# Patient Record
Sex: Female | Born: 1940 | Race: Black or African American | Hispanic: No | Smoking: Former smoker
Health system: Southern US, Community
[De-identification: ages and names within clinical notes are randomized; demographics above are authoritative.]

## PROBLEM LIST (undated history)

## (undated) DIAGNOSIS — B029 Zoster without complications: Secondary | ICD-10-CM

## (undated) DIAGNOSIS — E039 Hypothyroidism, unspecified: Secondary | ICD-10-CM

## (undated) DIAGNOSIS — M1990 Unspecified osteoarthritis, unspecified site: Secondary | ICD-10-CM

## (undated) DIAGNOSIS — F419 Anxiety disorder, unspecified: Secondary | ICD-10-CM

## (undated) DIAGNOSIS — K219 Gastro-esophageal reflux disease without esophagitis: Secondary | ICD-10-CM

## (undated) DIAGNOSIS — I10 Essential (primary) hypertension: Secondary | ICD-10-CM

## (undated) HISTORY — PX: OTHER SURGICAL HISTORY: SHX169

## (undated) HISTORY — DX: Essential (primary) hypertension: I10

## (undated) HISTORY — PX: APPENDECTOMY: SHX54

## (undated) HISTORY — PX: TUBAL LIGATION: SHX77

## (undated) HISTORY — PX: GANGLION CYST EXCISION: SHX1691

---

## 1999-02-28 ENCOUNTER — Other Ambulatory Visit: Admission: RE | Admit: 1999-02-28 | Discharge: 1999-02-28 | Payer: Self-pay | Admitting: Obstetrics & Gynecology

## 2000-06-16 ENCOUNTER — Other Ambulatory Visit: Admission: RE | Admit: 2000-06-16 | Discharge: 2000-06-16 | Payer: Self-pay | Admitting: Obstetrics & Gynecology

## 2000-08-11 ENCOUNTER — Encounter: Admission: RE | Admit: 2000-08-11 | Discharge: 2000-11-09 | Payer: Self-pay | Admitting: Internal Medicine

## 2001-02-03 ENCOUNTER — Other Ambulatory Visit: Admission: RE | Admit: 2001-02-03 | Discharge: 2001-02-03 | Payer: Self-pay | Admitting: Obstetrics & Gynecology

## 2001-11-18 ENCOUNTER — Encounter: Payer: Self-pay | Admitting: Internal Medicine

## 2001-11-18 ENCOUNTER — Encounter: Admission: RE | Admit: 2001-11-18 | Discharge: 2001-11-18 | Payer: Self-pay | Admitting: Internal Medicine

## 2002-02-11 ENCOUNTER — Other Ambulatory Visit: Admission: RE | Admit: 2002-02-11 | Discharge: 2002-02-11 | Payer: Self-pay | Admitting: Obstetrics and Gynecology

## 2002-02-21 ENCOUNTER — Inpatient Hospital Stay (HOSPITAL_COMMUNITY): Admission: RE | Admit: 2002-02-21 | Discharge: 2002-02-22 | Payer: Self-pay | Admitting: Orthopedic Surgery

## 2002-02-21 ENCOUNTER — Encounter: Payer: Self-pay | Admitting: Neurosurgery

## 2003-03-27 ENCOUNTER — Other Ambulatory Visit: Admission: RE | Admit: 2003-03-27 | Discharge: 2003-03-27 | Payer: Self-pay | Admitting: Obstetrics & Gynecology

## 2003-08-29 ENCOUNTER — Ambulatory Visit (HOSPITAL_COMMUNITY): Admission: RE | Admit: 2003-08-29 | Discharge: 2003-08-29 | Payer: Self-pay | Admitting: Gastroenterology

## 2004-09-02 ENCOUNTER — Other Ambulatory Visit: Admission: RE | Admit: 2004-09-02 | Discharge: 2004-09-02 | Payer: Self-pay | Admitting: Obstetrics & Gynecology

## 2005-09-29 ENCOUNTER — Other Ambulatory Visit: Admission: RE | Admit: 2005-09-29 | Discharge: 2005-09-29 | Payer: Self-pay | Admitting: Obstetrics & Gynecology

## 2010-10-05 ENCOUNTER — Emergency Department (HOSPITAL_COMMUNITY)
Admission: EM | Admit: 2010-10-05 | Discharge: 2010-10-05 | Payer: Self-pay | Source: Home / Self Care | Admitting: Emergency Medicine

## 2010-10-08 ENCOUNTER — Inpatient Hospital Stay (HOSPITAL_COMMUNITY)
Admission: RE | Admit: 2010-10-08 | Discharge: 2010-10-10 | Payer: Self-pay | Source: Home / Self Care | Attending: Orthopedic Surgery | Admitting: Orthopedic Surgery

## 2011-01-14 LAB — URINALYSIS, ROUTINE W REFLEX MICROSCOPIC
Glucose, UA: NEGATIVE mg/dL
Nitrite: NEGATIVE
Protein, ur: NEGATIVE mg/dL
Specific Gravity, Urine: 1.026 (ref 1.005–1.030)
Urobilinogen, UA: 0.2 mg/dL (ref 0.0–1.0)
pH: 5 (ref 5.0–8.0)

## 2011-01-14 LAB — CBC
HCT: 40.3 % (ref 36.0–46.0)
Hemoglobin: 13.7 g/dL (ref 12.0–15.0)
MCH: 31.9 pg (ref 26.0–34.0)
MCHC: 34 g/dL (ref 30.0–36.0)
MCV: 93.7 fL (ref 78.0–100.0)
Platelets: 227 10*3/uL (ref 150–400)
RBC: 4.3 MIL/uL (ref 3.87–5.11)
RDW: 13.3 % (ref 11.5–15.5)
WBC: 8.9 10*3/uL (ref 4.0–10.5)

## 2011-01-14 LAB — COMPREHENSIVE METABOLIC PANEL
ALT: 24 U/L (ref 0–35)
AST: 30 U/L (ref 0–37)
Albumin: 4.2 g/dL (ref 3.5–5.2)
Alkaline Phosphatase: 75 U/L (ref 39–117)
BUN: 10 mg/dL (ref 6–23)
CO2: 28 mEq/L (ref 19–32)
Calcium: 9.9 mg/dL (ref 8.4–10.5)
Chloride: 101 mEq/L (ref 96–112)
Creatinine, Ser: 0.7 mg/dL (ref 0.4–1.2)
GFR calc Af Amer: >60 mL/min (ref >60)
GFR calc non Af Amer: >60 mL/min (ref >60)
Glucose, Bld: 89 mg/dL (ref 70–99)
Potassium: 3.7 mEq/L (ref 3.5–5.1)
Sodium: 139 mEq/L (ref 135–145)
Total Bilirubin: 0.7 mg/dL (ref 0.3–1.2)
Total Protein: 7.2 g/dL (ref 6.0–8.3)

## 2011-01-14 LAB — DIFFERENTIAL
Basophils Absolute: 0.1 10*3/uL (ref 0.0–0.1)
Basophils Relative: 1 % (ref 0–1)
Eosinophils Absolute: 0.2 10*3/uL (ref 0.0–0.7)
Eosinophils Relative: 2 % (ref 0–5)
Lymphocytes Relative: 33 % (ref 12–46)
Lymphs Abs: 2.9 10*3/uL (ref 0.7–4.0)
Monocytes Absolute: 0.7 10*3/uL (ref 0.1–1.0)
Monocytes Relative: 8 % (ref 3–12)
Neutro Abs: 5.1 10*3/uL (ref 1.7–7.7)
Neutrophils Relative %: 57 % (ref 43–77)

## 2011-01-14 LAB — APTT
aPTT: 26 seconds (ref 24–37)
aPTT: >200 seconds (ref 24–37)

## 2011-01-14 LAB — URINE MICROSCOPIC-ADD ON

## 2011-01-14 LAB — SURGICAL PCR SCREEN
MRSA, PCR: NEGATIVE
Staphylococcus aureus: NEGATIVE

## 2011-01-14 LAB — PROTIME-INR
INR: 1.02 (ref 0.00–1.49)
INR: 1.04 (ref 0.00–1.49)
INR: 1.05 (ref 0.00–1.49)
INR: 1.3 (ref 0.00–1.49)
Prothrombin Time: 13.6 seconds (ref 11.6–15.2)
Prothrombin Time: 13.8 seconds (ref 11.6–15.2)
Prothrombin Time: 13.9 seconds (ref 11.6–15.2)
Prothrombin Time: 16.4 seconds — ABNORMAL HIGH (ref 11.6–15.2)

## 2011-03-21 NOTE — Op Note (Signed)
[place]  Patient:    Katrina Wall, Katrina Wall Visit Number: [id number] MRN: [id number]          Service Type: SUR Location: [id number] Attending Physician:  Barton Fanny Dictated by:   Hewitt Shorts, M.D. Proc. 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  OPERATION PERFORMED:  C3-4 and C4-5 anterior cervical diskectomy and arthrodesis with iliac crest allograft and tether cervical plating.  SURGEON:  Hewitt Shorts, M.D.  ANESTHESIA:  General endotracheal.  INDICATIONS FOR PROCEDURE:  The patient is a 70 year old woman who presented with numbness in her right hand.  She was found to have a large central disk herniation with spinal cord compression at the C3-4 level with advanced spondylosis and degenerative disk disease at C4-5.  Decision was made to proceed with a two-level anterior cervical decompression and arthrodesis.  DESCRIPTION OF PROCEDURE:  The patient was brought to the operating room and placed under general endotracheal anesthesia.  The patient was placed in 10 pounds of halter traction and then the neck was prepped with Betadine soap and solution and draped in sterile fashion.  A horizontal incision was made on the left side of the neck.  The line of incision was infiltrated with local anesthetic with epinephrine and the incision was made with a Shaw scalpel at a temp of 120.  Dissection was carried down through the subcutaneous tissues and platysma and then dissection was carried out to an avascular plane leaving a sternocleidomastoid, carotid artery and jugular vein laterally and trachea and esophagus medially.  The ventral aspect of the  vertebral column was identified and a localizing x-ray was taken and the C3-4 and C4-5 intervertebral disk spaces were identified.  The diskectomy was begun at each level with incision of the annulus and continued with microcurets and pituitary rongeurs.  The microscope was draped and brought into the field to provide additional magnification, illumination and visualization.  The remainder of the procedure was performed using microdissection and microsurgical technique. The cartilaginous end plates of the corresponding vertebrae were removed using microcurets and the Micromax drill and then posterior osteophytic overgrowth at each level was removed using the Micromax drill and a 10 mm Kerrison punch with a thin foot plate.  At each level we encountered significant spondylosis, degenerative disk disease and herniation and this material was removed allowing [place] to decompress the thecal sac and spinal canal.  Once the decompression was completed, hemostasis was established with the use of Gelfoam soaked in thrombin and then we placed a graft of iliac crest allograft in each intervertebral disk space. Each of the grafts was cut and shaped to size and positioned and countersunk. We then selected a 35 mm tether cervical plate and it was positioned over the fusion construct and secured with 4.0 x 13 mm screws at C3, at C5 and a single 4.0 x 14 mm screw at C4.  All screw holes were drilled, tapped and then the screw placed.  Final tightening was done of all five screws.  An x-ray was taken which showed the graft, plate and screws all in good position, the alignment was good.  The wound was irrigated with bacitracin solution and checked for hemostasis which was established and confirmed.  Then we proceeded with closure.  The platysma was closed with interrupted inverted 2-0 undyed Vicryl sutures.  The subcutaneous and subcuticular layer were closed with interrupted inverted 3-0 undyed Vicryl sutures  and the skin edges were approximated with Dermabond.  The patient tolerated the procedure well. Estimated blood loss was 200 cc.  Sponge, needle and instrument counts were correct.  Following surgery, the patient was taken out of cervical traction and was placed in a soft cervical collar to be reversed from anesthetic, extubated and transferred to the recovery room for further care. Dictated by:   Hewitt Shorts, M.D. Attending Physician:  Barton Fanny DD:  02/21/02 TD:  02/22/02 Job: [id number]

## 2012-05-25 DIAGNOSIS — E039 Hypothyroidism, unspecified: Secondary | ICD-10-CM | POA: Diagnosis not present

## 2012-05-31 DIAGNOSIS — Z1231 Encounter for screening mammogram for malignant neoplasm of breast: Secondary | ICD-10-CM | POA: Diagnosis not present

## 2012-06-21 DIAGNOSIS — E039 Hypothyroidism, unspecified: Secondary | ICD-10-CM | POA: Diagnosis not present

## 2012-06-24 DIAGNOSIS — Z130 Encounter for screening for diseases of the blood and blood-forming organs and certain disorders involving the immune mechanism: Secondary | ICD-10-CM | POA: Diagnosis not present

## 2012-06-24 DIAGNOSIS — Z1212 Encounter for screening for malignant neoplasm of rectum: Secondary | ICD-10-CM | POA: Diagnosis not present

## 2012-06-24 DIAGNOSIS — M810 Age-related osteoporosis without current pathological fracture: Secondary | ICD-10-CM | POA: Diagnosis not present

## 2012-06-24 DIAGNOSIS — Z124 Encounter for screening for malignant neoplasm of cervix: Secondary | ICD-10-CM | POA: Diagnosis not present

## 2012-07-26 DIAGNOSIS — I10 Essential (primary) hypertension: Secondary | ICD-10-CM | POA: Diagnosis not present

## 2012-07-26 DIAGNOSIS — M949 Disorder of cartilage, unspecified: Secondary | ICD-10-CM | POA: Diagnosis not present

## 2012-07-26 DIAGNOSIS — J309 Allergic rhinitis, unspecified: Secondary | ICD-10-CM | POA: Diagnosis not present

## 2012-07-26 DIAGNOSIS — M109 Gout, unspecified: Secondary | ICD-10-CM | POA: Diagnosis not present

## 2012-07-26 DIAGNOSIS — E039 Hypothyroidism, unspecified: Secondary | ICD-10-CM | POA: Diagnosis not present

## 2012-07-26 DIAGNOSIS — Z7901 Long term (current) use of anticoagulants: Secondary | ICD-10-CM | POA: Diagnosis not present

## 2012-07-26 DIAGNOSIS — M899 Disorder of bone, unspecified: Secondary | ICD-10-CM | POA: Diagnosis not present

## 2012-08-12 DIAGNOSIS — Z23 Encounter for immunization: Secondary | ICD-10-CM | POA: Diagnosis not present

## 2012-08-23 DIAGNOSIS — E039 Hypothyroidism, unspecified: Secondary | ICD-10-CM | POA: Diagnosis not present

## 2012-08-27 DIAGNOSIS — E039 Hypothyroidism, unspecified: Secondary | ICD-10-CM | POA: Diagnosis not present

## 2012-10-03 HISTORY — PX: OTHER SURGICAL HISTORY: SHX169

## 2012-10-08 DIAGNOSIS — B029 Zoster without complications: Secondary | ICD-10-CM | POA: Diagnosis not present

## 2012-10-08 HISTORY — DX: Zoster without complications: B02.9

## 2012-11-30 DIAGNOSIS — E039 Hypothyroidism, unspecified: Secondary | ICD-10-CM | POA: Diagnosis not present

## 2012-12-22 DIAGNOSIS — E039 Hypothyroidism, unspecified: Secondary | ICD-10-CM | POA: Diagnosis not present

## 2013-01-24 DIAGNOSIS — M899 Disorder of bone, unspecified: Secondary | ICD-10-CM | POA: Diagnosis not present

## 2013-01-24 DIAGNOSIS — E039 Hypothyroidism, unspecified: Secondary | ICD-10-CM | POA: Diagnosis not present

## 2013-01-24 DIAGNOSIS — E782 Mixed hyperlipidemia: Secondary | ICD-10-CM | POA: Diagnosis not present

## 2013-01-24 DIAGNOSIS — Z0000 Encounter for general adult medical examination without abnormal findings: Secondary | ICD-10-CM | POA: Diagnosis not present

## 2013-01-24 DIAGNOSIS — M109 Gout, unspecified: Secondary | ICD-10-CM | POA: Diagnosis not present

## 2013-01-24 DIAGNOSIS — M25569 Pain in unspecified knee: Secondary | ICD-10-CM | POA: Diagnosis not present

## 2013-01-24 DIAGNOSIS — M949 Disorder of cartilage, unspecified: Secondary | ICD-10-CM | POA: Diagnosis not present

## 2013-01-24 DIAGNOSIS — I10 Essential (primary) hypertension: Secondary | ICD-10-CM | POA: Diagnosis not present

## 2013-01-24 DIAGNOSIS — Z1331 Encounter for screening for depression: Secondary | ICD-10-CM | POA: Diagnosis not present

## 2013-01-26 DIAGNOSIS — M949 Disorder of cartilage, unspecified: Secondary | ICD-10-CM | POA: Diagnosis not present

## 2013-01-26 DIAGNOSIS — I10 Essential (primary) hypertension: Secondary | ICD-10-CM | POA: Diagnosis not present

## 2013-01-26 DIAGNOSIS — Z0000 Encounter for general adult medical examination without abnormal findings: Secondary | ICD-10-CM | POA: Diagnosis not present

## 2013-01-26 DIAGNOSIS — E039 Hypothyroidism, unspecified: Secondary | ICD-10-CM | POA: Diagnosis not present

## 2013-01-26 DIAGNOSIS — E782 Mixed hyperlipidemia: Secondary | ICD-10-CM | POA: Diagnosis not present

## 2013-01-26 DIAGNOSIS — M899 Disorder of bone, unspecified: Secondary | ICD-10-CM | POA: Diagnosis not present

## 2013-05-05 DIAGNOSIS — H47099 Other disorders of optic nerve, not elsewhere classified, unspecified eye: Secondary | ICD-10-CM | POA: Diagnosis not present

## 2013-06-13 DIAGNOSIS — M1710 Unilateral primary osteoarthritis, unspecified knee: Secondary | ICD-10-CM | POA: Diagnosis not present

## 2013-06-13 DIAGNOSIS — IMO0002 Reserved for concepts with insufficient information to code with codable children: Secondary | ICD-10-CM | POA: Diagnosis not present

## 2013-06-14 ENCOUNTER — Other Ambulatory Visit: Payer: Self-pay | Admitting: *Deleted

## 2013-06-14 DIAGNOSIS — E039 Hypothyroidism, unspecified: Secondary | ICD-10-CM | POA: Insufficient documentation

## 2013-06-21 ENCOUNTER — Other Ambulatory Visit (INDEPENDENT_AMBULATORY_CARE_PROVIDER_SITE_OTHER): Payer: Medicare Other

## 2013-06-21 DIAGNOSIS — E039 Hypothyroidism, unspecified: Secondary | ICD-10-CM

## 2013-06-21 LAB — TSH: TSH: 5.98 u[IU]/mL — ABNORMAL HIGH (ref 0.35–5.50)

## 2013-06-21 LAB — T4, FREE: Free T4: 0.78 ng/dL (ref 0.60–1.60)

## 2013-06-22 ENCOUNTER — Ambulatory Visit: Payer: Self-pay | Admitting: Endocrinology

## 2013-06-23 ENCOUNTER — Encounter: Payer: Self-pay | Admitting: Endocrinology

## 2013-06-23 ENCOUNTER — Ambulatory Visit (INDEPENDENT_AMBULATORY_CARE_PROVIDER_SITE_OTHER): Payer: Medicare Other | Admitting: Endocrinology

## 2013-06-23 VITALS — BP 130/70 | HR 70 | Temp 98.6°F | Resp 12 | Ht 64.0 in | Wt 179.1 lb

## 2013-06-23 DIAGNOSIS — E039 Hypothyroidism, unspecified: Secondary | ICD-10-CM

## 2013-06-23 MED ORDER — LEVOTHYROXINE SODIUM 125 MCG PO TABS: 125.0000 ug | ORAL_TABLET | Freq: Every day | ORAL | Status: DC

## 2013-06-23 NOTE — Patient Instructions (Addendum)
Change to 125 levothyroxine

## 2013-06-23 NOTE — Progress Notes (Signed)
Patient ID: Katrina Wall, female   DOB: 01/30/1941, 72 y.o.   MRN: [id number]  Reason for Appointment:  Hypothyroidism, followup visit    History of Present Illness:     She had been on a stable dose of levothyroxine for her hypothyroidism for sometime The symptoms consistent with hypothyroidism at the time of diagnosis : none      The treatments that the patient has taken include generic Synthroid, now taking 112 mcg.       Last visit was in 2/14  and her dosage was not changed at that time  Currently she does not complain of any significant fatigue         Compliance with the medical regimen has been as prescribed with taking the tablet in the morning before breakfast.  Appointment on 06/21/2013  Component Date Value Range Status  . Free T4 06/21/2013 0.78  0.60 - 1.60 ng/dL Final  . TSH 30/86/5784 5.98* 0.35 - 5.50 uIU/mL Final      Medication List       This list is accurate as of: 06/23/13 11:59 PM.  Always use your most recent med list.               Acai 500 MG Caps  Take 500 mg by mouth.     allopurinol 100 MG tablet  Commonly known as:  ZYLOPRIM  Take 100 mg by mouth 2 (two) times daily.     aspirin 81 MG tablet  Take 81 mg by mouth daily.     Calcium-Magnesium-Zinc 167-83-8 MG Tabs  Take by mouth 3 (three) times daily.     colchicine 0.6 MG tablet  Take 0.6 mg by mouth daily. PRN for gout     Fish Oil 1000 MG Caps  Take by mouth.     levothyroxine 125 MCG tablet  Commonly known as:  SYNTHROID, LEVOTHROID  Take 1 tablet (125 mcg total) by mouth daily before breakfast.     lisinopril-hydrochlorothiazide 10-12.5 MG per tablet  Commonly known as:  PRINZIDE,ZESTORETIC  Take 2 tablets by mouth daily.     LORazepam 0.5 MG tablet  Commonly known as:  ATIVAN  Take 0.5 mg by mouth every 8 (eight) hours.     multivitamin with minerals Tabs tablet  Take 1 tablet by mouth daily.     OSTEO BI-FLEX ADV DOUBLE ST PO  Take by mouth.     Potassium 99 MG  Tabs  Take by mouth.     raloxifene 60 MG tablet  Commonly known as:  EVISTA  Take 60 mg by mouth daily.     SUPER B COMPLEX MAXI PO  Take by mouth.     VALTREX 1000 MG tablet  Generic drug:  valACYclovir  Take 1,000 mg by mouth daily. PRN     vitamin C 500 MG tablet  Commonly known as:  ASCORBIC ACID  Take 500 mg by mouth daily.     Vitamin D3 1000 UNITS Caps  Take 1,000 Units by mouth daily.        Past Medical History  Diagnosis Date  . Hypertension     No past surgical history on file.  No family history on file.  Social History:  reports that she quit smoking about 30 years ago. She has never used smokeless tobacco. Her alcohol and drug histories are not on file.  Allergies:  Allergies  Allergen Reactions  . Tylenol [Acetaminophen]     Large quantities cause a rash  .  Augmentin [Amoxicillin-Pot Clavulanate] Rash   REVIEW of systems:  She has a history of hypertension    Examination:   BP 130/70  Pulse 70  Temp(Src) 98.6 F (37 C)  Resp 12  Ht 5\' 4"  (1.626 m)  Wt 179 lb 1.6 oz (81.239 kg)  BMI 30.73 kg/m2  SpO2 98%   GENERAL APPEARANCE: Alert And looks well.    FACE: No puffiness of face or hands        NECK: no thyromegaly.          NEUROLOGIC EXAM:  biceps reflexes are normal bilaterally    Assessments   Hypothyroidism, mild with minimal symptoms even at diagnosis. No evidence of goiter on exam Currently her TSH is trending higher even with her taking her medication regularly.   Treatment:  Her dose will be increased to 125 mcg and she will come back short-term for followup  Tahirah Sara 06/29/2013, 4:00 PM

## 2013-06-27 DIAGNOSIS — M1710 Unilateral primary osteoarthritis, unspecified knee: Secondary | ICD-10-CM | POA: Diagnosis not present

## 2013-06-27 DIAGNOSIS — IMO0002 Reserved for concepts with insufficient information to code with codable children: Secondary | ICD-10-CM | POA: Diagnosis not present

## 2013-06-29 ENCOUNTER — Encounter: Payer: Self-pay | Admitting: Endocrinology

## 2013-07-11 DIAGNOSIS — IMO0002 Reserved for concepts with insufficient information to code with codable children: Secondary | ICD-10-CM | POA: Diagnosis not present

## 2013-07-11 DIAGNOSIS — M1710 Unilateral primary osteoarthritis, unspecified knee: Secondary | ICD-10-CM | POA: Diagnosis not present

## 2013-07-13 DIAGNOSIS — Z9189 Other specified personal risk factors, not elsewhere classified: Secondary | ICD-10-CM | POA: Diagnosis not present

## 2013-07-13 DIAGNOSIS — Z124 Encounter for screening for malignant neoplasm of cervix: Secondary | ICD-10-CM | POA: Diagnosis not present

## 2013-07-13 DIAGNOSIS — Z130 Encounter for screening for diseases of the blood and blood-forming organs and certain disorders involving the immune mechanism: Secondary | ICD-10-CM | POA: Diagnosis not present

## 2013-07-25 ENCOUNTER — Encounter (HOSPITAL_COMMUNITY): Payer: Self-pay | Admitting: Pharmacy Technician

## 2013-07-25 ENCOUNTER — Other Ambulatory Visit: Payer: Self-pay | Admitting: Gastroenterology

## 2013-07-27 ENCOUNTER — Encounter (HOSPITAL_COMMUNITY): Payer: Self-pay | Admitting: *Deleted

## 2013-07-27 DIAGNOSIS — E039 Hypothyroidism, unspecified: Secondary | ICD-10-CM | POA: Diagnosis not present

## 2013-07-27 DIAGNOSIS — F411 Generalized anxiety disorder: Secondary | ICD-10-CM | POA: Diagnosis not present

## 2013-07-27 DIAGNOSIS — K219 Gastro-esophageal reflux disease without esophagitis: Secondary | ICD-10-CM | POA: Diagnosis not present

## 2013-07-27 DIAGNOSIS — M109 Gout, unspecified: Secondary | ICD-10-CM | POA: Diagnosis not present

## 2013-07-27 DIAGNOSIS — I10 Essential (primary) hypertension: Secondary | ICD-10-CM | POA: Diagnosis not present

## 2013-08-15 NOTE — Anesthesia Preprocedure Evaluation (Addendum)
Anesthesia Evaluation  Patient identified by MRN, date of birth, ID band Patient awake    Reviewed: Allergy & Precautions, H&P , NPO status , Patient's Chart, lab work & pertinent test results  Airway Mallampati: II TM Distance: >3 FB Neck ROM: Full    Dental  (+) Dental Advisory Given   Pulmonary former smoker,  breath sounds clear to auscultation        Cardiovascular hypertension, Pt. on medications Rhythm:Regular Rate:Normal     Neuro/Psych PSYCHIATRIC DISORDERS Anxiety negative neurological ROS     GI/Hepatic Neg liver ROS, GERD-  Medicated,  Endo/Other  Hypothyroidism   Renal/GU negative Renal ROS     Musculoskeletal negative musculoskeletal ROS (+)   Abdominal   Peds  Hematology negative hematology ROS (+)   Anesthesia Other Findings   Reproductive/Obstetrics negative OB ROS                          Anesthesia Physical Anesthesia Plan  ASA: II  Anesthesia Plan: MAC   Post-op Pain Management:    Induction: Intravenous  Airway Management Planned:   Additional Equipment:   Intra-op Plan:   Post-operative Plan:   Informed Consent: I have reviewed the patients History and Physical, chart, labs and discussed the procedure including the risks, benefits and alternatives for the proposed anesthesia with the patient or authorized representative who has indicated his/her understanding and acceptance.   Dental advisory given  Plan Discussed with: CRNA  Anesthesia Plan Comments:         Anesthesia Quick Evaluation

## 2013-08-16 ENCOUNTER — Encounter (HOSPITAL_COMMUNITY): Payer: Self-pay | Admitting: *Deleted

## 2013-08-16 ENCOUNTER — Ambulatory Visit (HOSPITAL_COMMUNITY)
Admission: RE | Admit: 2013-08-16 | Discharge: 2013-08-16 | Disposition: A | Discharge status: Home / Self Care | Payer: Medicare Other | Source: Ambulatory Visit | Attending: Gastroenterology | Admitting: Gastroenterology

## 2013-08-16 ENCOUNTER — Ambulatory Visit (HOSPITAL_COMMUNITY): Payer: Medicare Other | Admitting: Anesthesiology

## 2013-08-16 ENCOUNTER — Encounter (HOSPITAL_COMMUNITY)
Admission: RE | Disposition: A | Discharge status: Home / Self Care | Payer: Self-pay | Source: Ambulatory Visit | Attending: Gastroenterology

## 2013-08-16 ENCOUNTER — Encounter (HOSPITAL_COMMUNITY): Payer: Medicare Other | Admitting: Anesthesiology

## 2013-08-16 DIAGNOSIS — K7689 Other specified diseases of liver: Secondary | ICD-10-CM | POA: Insufficient documentation

## 2013-08-16 DIAGNOSIS — E039 Hypothyroidism, unspecified: Secondary | ICD-10-CM | POA: Diagnosis not present

## 2013-08-16 DIAGNOSIS — Z1211 Encounter for screening for malignant neoplasm of colon: Secondary | ICD-10-CM | POA: Insufficient documentation

## 2013-08-16 DIAGNOSIS — I10 Essential (primary) hypertension: Secondary | ICD-10-CM | POA: Diagnosis not present

## 2013-08-16 DIAGNOSIS — E7800 Pure hypercholesterolemia, unspecified: Secondary | ICD-10-CM | POA: Diagnosis not present

## 2013-08-16 HISTORY — DX: Unspecified osteoarthritis, unspecified site: M19.90

## 2013-08-16 HISTORY — PX: COLONOSCOPY WITH PROPOFOL: SHX5780

## 2013-08-16 HISTORY — DX: Hypothyroidism, unspecified: E03.9

## 2013-08-16 HISTORY — DX: Anxiety disorder, unspecified: F41.9

## 2013-08-16 HISTORY — DX: Zoster without complications: B02.9

## 2013-08-16 HISTORY — DX: Gastro-esophageal reflux disease without esophagitis: K21.9

## 2013-08-16 SURGERY — COLONOSCOPY WITH PROPOFOL
Anesthesia: Monitor Anesthesia Care

## 2013-08-16 MED ORDER — PROPOFOL INFUSION 10 MG/ML OPTIME
INTRAVENOUS | Status: DC | PRN
  Administered 2013-08-16: 175 ug/kg/min via INTRAVENOUS

## 2013-08-16 MED ORDER — MIDAZOLAM HCL 5 MG/5ML IJ SOLN
INTRAMUSCULAR | Status: DC | PRN
  Administered 2013-08-16: 2 mg via INTRAVENOUS

## 2013-08-16 MED ORDER — SODIUM CHLORIDE 0.9 % IV SOLN: INTRAVENOUS | Status: DC

## 2013-08-16 MED ORDER — LACTATED RINGERS IV SOLN
INTRAVENOUS | Status: DC
  Administered 2013-08-16: 1000 mL via INTRAVENOUS

## 2013-08-16 MED ORDER — FENTANYL CITRATE 0.05 MG/ML IJ SOLN
INTRAMUSCULAR | Status: DC | PRN
  Administered 2013-08-16: 50 ug via INTRAVENOUS

## 2013-08-16 SURGICAL SUPPLY — 22 items

## 2013-08-16 NOTE — Transfer of Care (Signed)
Immediate Anesthesia Transfer of Care Note  Patient: Katrina Wall  Procedure(s) Performed: Procedure(s): COLONOSCOPY WITH PROPOFOL (N/A)  Patient Location: PACU  Anesthesia Type:MAC  Level of Consciousness: awake, alert , oriented and responds to stimulation  Airway & Oxygen Therapy: Patient Spontanous Breathing  Post-op Assessment: Report given to PACU RN, Post -op Vital signs reviewed and stable and Patient moving all extremities X 4  Post vital signs: stable  Complications: No apparent anesthesia complications

## 2013-08-16 NOTE — Op Note (Signed)
Procedure: Screening colonoscopy  Endoscopist: Danise Edge  Premedication: Propofol administered by anesthesia  Procedure: The patient was placed in the left lateral decubitus position. Anal inspection and digital rectal exam were normal. The Pentax pediatric colonoscope was introduced into the rectum and advanced to the cecum. A normal-appearing ileocecal valve and appendiceal orifice were identified. Colonic preparation for the exam today was good.  Rectum. Normal. Retroflexed view of the distal rectum normal.  Sigmoid colon and descending colon: Normal.  Splenic flexure. Normal.  Transverse colon. Normal.  Hepatic flexure. Normal.  Ascending colon. Normal.  Cecum and ileocecal valve. Normal.  Assessment: Normal screening proctocolonoscopy to the cecum.

## 2013-08-16 NOTE — H&P (Signed)
  Procedure: Screening colonoscopy  History: The patient is a 72 year old female born August 29, 1941. On 08/29/2003, the patient underwent a normal screening colonoscopy.  The patient is scheduled to undergo a repeat screening colonoscopy today  Medication allergies: Augmentin causes diarrhea. Tylenol causes rash.  Past medical history: Hypertension. Hypercholesterolemia. Hypothyroidism. Chronic anxiety. Osteopenia. Gout. Allergic rhinitis. Cervical disc disease. Hepatic steatosis. Gastroesophageal reflux. Colonic diverticulosis. Right foot fracture. Cervical disc surgery. Tonsillectomy. Cesarean section. Appendectomy. Tubal ligation. Right ankle fracture surgery.  Exam: The patient is alert and lying comfortably on the endoscopy stretcher. Cardiac exam reveals a regular rhythm. Lungs are clear to auscultation. Abdomen is soft and nontender to palpation.  Plan: Proceed with screening colonoscopy using propofol sedation.

## 2013-08-16 NOTE — Anesthesia Postprocedure Evaluation (Signed)
Anesthesia Post Note  Patient: Katrina Wall  Procedure(s) Performed: Procedure(s) (LRB): COLONOSCOPY WITH PROPOFOL (N/A)  Anesthesia type: MAC  Patient location: PACU  Post pain: Pain level controlled  Post assessment: Post-op Vital signs reviewed  Last Vitals: BP 128/51  Pulse 80  Temp(Src) 36.5 C (Oral)  Resp 17  Ht 5\' 4"  (1.626 m)  Wt 180 lb (81.647 kg)  BMI 30.88 kg/m2  SpO2 98%  Post vital signs: Reviewed  Level of consciousness: awake  Complications: No apparent anesthesia complications

## 2013-08-17 ENCOUNTER — Encounter (HOSPITAL_COMMUNITY): Payer: Self-pay | Admitting: Gastroenterology

## 2013-08-18 DIAGNOSIS — Z1211 Encounter for screening for malignant neoplasm of colon: Secondary | ICD-10-CM | POA: Diagnosis not present

## 2013-08-25 DIAGNOSIS — Z1231 Encounter for screening mammogram for malignant neoplasm of breast: Secondary | ICD-10-CM | POA: Diagnosis not present

## 2013-09-19 ENCOUNTER — Other Ambulatory Visit (INDEPENDENT_AMBULATORY_CARE_PROVIDER_SITE_OTHER): Payer: Medicare Other

## 2013-09-19 DIAGNOSIS — E039 Hypothyroidism, unspecified: Secondary | ICD-10-CM | POA: Diagnosis not present

## 2013-09-19 LAB — TSH: TSH: 1.09 u[IU]/mL (ref 0.35–5.50)

## 2013-09-19 LAB — T4, FREE: Free T4: 1.02 ng/dL (ref 0.60–1.60)

## 2013-09-21 ENCOUNTER — Encounter: Payer: Self-pay | Admitting: Endocrinology

## 2013-09-21 ENCOUNTER — Telehealth: Payer: Self-pay | Admitting: Endocrinology

## 2013-09-21 ENCOUNTER — Ambulatory Visit (INDEPENDENT_AMBULATORY_CARE_PROVIDER_SITE_OTHER): Payer: Medicare Other | Admitting: Endocrinology

## 2013-09-21 VITALS — BP 124/62 | HR 100 | Temp 98.3°F | Resp 12 | Ht 64.0 in | Wt 180.9 lb

## 2013-09-21 DIAGNOSIS — E039 Hypothyroidism, unspecified: Secondary | ICD-10-CM

## 2013-09-21 DIAGNOSIS — I10 Essential (primary) hypertension: Secondary | ICD-10-CM | POA: Diagnosis not present

## 2013-09-21 DIAGNOSIS — Z23 Encounter for immunization: Secondary | ICD-10-CM | POA: Diagnosis not present

## 2013-09-21 NOTE — Patient Instructions (Signed)
Same dose 

## 2013-09-21 NOTE — Progress Notes (Signed)
Patient ID: Katrina Wall, female   DOB: 05-20-1941, 72 y.o.   MRN: [id number]  Reason for Appointment:  Hypothyroidism, followup visit    History of Present Illness:     She had been on a stable dose of levothyroxine for her hypothyroidism for ? 20 years The symptoms consistent with hypothyroidism at the time of diagnosis : none      The treatments that the patient has taken include generic Synthroid, now taking 125 mcg. Since 8/14        At that time she had a TSH of about 6 but was asymptomatic Also does not feel any different with increasing her dose, no fatigue, cold intolerance or hair loss. No significant weight change      Compliance with the medical regimen has been as prescribed with taking the tablet on empty stomach during the night when she gets up to go to the bathroom  TSH is now excellent at 1.1 with normal free T4  LABS:  Lab Results  Component Value Date   TSH 1.09 09/19/2013   TSH 5.98* 06/21/2013   FREET4 1.02 09/19/2013   FREET4 0.78 06/21/2013       Medication List       This list is accurate as of: 09/21/13 10:23 AM.  Always use your most recent med list.               Acai 500 MG Caps  Take 500 mg by mouth.     allopurinol 100 MG tablet  Commonly known as:  ZYLOPRIM  Take 200 mg by mouth daily.     aspirin 81 MG tablet  Take 81 mg by mouth daily.     Calcium-Magnesium-Zinc 167-83-8 MG Tabs  Take by mouth 3 (three) times daily.     colchicine 0.6 MG tablet  Take 0.6 mg by mouth daily as needed (for gout).     esomeprazole 20 MG capsule  Commonly known as:  NEXIUM  Take 20 mg by mouth as needed.     Fish Oil 1000 MG Caps  Take by mouth.     indomethacin 25 MG capsule  Commonly known as:  INDOCIN  Take 25 mg by mouth 2 (two) times daily with a meal.     levothyroxine 125 MCG tablet  Commonly known as:  SYNTHROID, LEVOTHROID  Take 1 tablet (125 mcg total) by mouth daily before breakfast.     lisinopril-hydrochlorothiazide 10-12.5  MG per tablet  Commonly known as:  PRINZIDE,ZESTORETIC  Take 1 tablet by mouth daily.     LORazepam 0.5 MG tablet  Commonly known as:  ATIVAN  Take 0.5 mg by mouth every 8 (eight) hours as needed for anxiety.     multivitamin with minerals Tabs tablet  Take 1 tablet by mouth daily.     OSTEO BI-FLEX ADV DOUBLE ST PO  Take 2 tablets by mouth daily.     potassium gluconate 595 MG Tabs tablet  Take 595 mg by mouth daily.     raloxifene 60 MG tablet  Commonly known as:  EVISTA  Take 60 mg by mouth daily.     SUPER B COMPLEX MAXI PO  Take by mouth.     valACYclovir 500 MG tablet  Commonly known as:  VALTREX  Take 500 mg by mouth daily as needed.     vitamin C 500 MG tablet  Commonly known as:  ASCORBIC ACID  Take 500 mg by mouth daily.     Vitamin D3  1000 UNITS Caps  Take 1,000 Units by mouth daily.        Past Medical History  Diagnosis Date  . Hypertension   . Hypothyroidism   . Anxiety   . GERD (gastroesophageal reflux disease)   . Arthritis     oa left knee  . Shingles 10-08-2012    under left breast and left shoulder blade    Past Surgical History  Procedure Laterality Date  .  c sections       x 2  . Ganglion cyst excision      right wrist  . Left ankle orif  10-2012  . Appendectomy  as child  . Tubal ligation    . Colonoscopy with propofol N/A 08/16/2013    Procedure: COLONOSCOPY WITH PROPOFOL;  Surgeon: Charolett Bumpers, MD;  Location: [facility];  Service: Endoscopy;  Laterality: N/A;    No family history on file.  Social History:  reports that she quit smoking about 30 years ago. Her smoking use included Cigarettes. She has a 17 pack-year smoking history. She has never used smokeless tobacco. She reports that she drinks alcohol. She reports that she does not use illicit drugs.  Allergies:  Allergies  Allergen Reactions  . Tylenol [Acetaminophen]     Large quantities cause a rash  . Augmentin [Amoxicillin-Pot Clavulanate] Diarrhea    REVIEW of systems:  She has a history of hypertension    Examination:   BP 124/62  Pulse 100  Temp(Src) 98.3 F (36.8 C)  Resp 12  Ht 5\' 4"  (1.626 m)  Wt 180 lb 14.4 oz (82.056 kg)  BMI 31.04 kg/m2  SpO2 99%   GENERAL APPEARANCE: Alert And looks well.    FACE: No puffiness of face or hands        NECK: no thyromegaly.          NEUROLOGIC EXAM:  biceps reflexes  show normal relaxation    Assessments   Hypothyroidism, with minimal symptoms even at diagnosis. She has shown mild progression of the condition since her TSH was high on her last visit  No evidence of goiter on exam Currently her TSH is perfectly normal with  125 mcg   She is compliant with her  medication regularly.   Treatment:  Her dose will be  Continued unchanged and she will return in 6 months for followup Discussed interactions of levothyroxine with iron and calcium   Daisy Lites 09/21/2013, 10:23 AM

## 2013-11-16 ENCOUNTER — Other Ambulatory Visit: Payer: Self-pay | Admitting: *Deleted

## 2013-11-16 DIAGNOSIS — E039 Hypothyroidism, unspecified: Secondary | ICD-10-CM

## 2013-11-16 MED ORDER — LEVOTHYROXINE SODIUM 125 MCG PO TABS
125.0000 ug | ORAL_TABLET | Freq: Every day | ORAL | Status: AC
Start: 2013-11-16 — End: ?

## 2014-01-26 DIAGNOSIS — Z23 Encounter for immunization: Secondary | ICD-10-CM | POA: Diagnosis not present

## 2014-01-26 DIAGNOSIS — E039 Hypothyroidism, unspecified: Secondary | ICD-10-CM | POA: Diagnosis not present

## 2014-01-26 DIAGNOSIS — F411 Generalized anxiety disorder: Secondary | ICD-10-CM | POA: Diagnosis not present

## 2014-01-26 DIAGNOSIS — Z1331 Encounter for screening for depression: Secondary | ICD-10-CM | POA: Diagnosis not present

## 2014-01-26 DIAGNOSIS — M109 Gout, unspecified: Secondary | ICD-10-CM | POA: Diagnosis not present

## 2014-01-26 DIAGNOSIS — Z0000 Encounter for general adult medical examination without abnormal findings: Secondary | ICD-10-CM | POA: Diagnosis not present

## 2014-01-26 DIAGNOSIS — I10 Essential (primary) hypertension: Secondary | ICD-10-CM | POA: Diagnosis not present

## 2014-03-20 ENCOUNTER — Other Ambulatory Visit (INDEPENDENT_AMBULATORY_CARE_PROVIDER_SITE_OTHER): Payer: Medicare Other

## 2014-03-20 DIAGNOSIS — E039 Hypothyroidism, unspecified: Secondary | ICD-10-CM

## 2014-03-20 LAB — T4, FREE: Free T4: 0.88 ng/dL (ref 0.60–1.60)

## 2014-03-20 LAB — TSH: TSH: 0.46 u[IU]/mL (ref 0.35–4.50)

## 2014-03-23 ENCOUNTER — Ambulatory Visit: Payer: Medicare Other | Admitting: Endocrinology

## 2014-04-16 ENCOUNTER — Other Ambulatory Visit: Payer: Self-pay | Admitting: Endocrinology

## 2014-08-03 DIAGNOSIS — E039 Hypothyroidism, unspecified: Secondary | ICD-10-CM | POA: Diagnosis not present

## 2014-08-23 DIAGNOSIS — E039 Hypothyroidism, unspecified: Secondary | ICD-10-CM | POA: Diagnosis not present

## 2014-08-23 DIAGNOSIS — E063 Autoimmune thyroiditis: Secondary | ICD-10-CM | POA: Diagnosis not present

## 2014-08-23 DIAGNOSIS — I10 Essential (primary) hypertension: Secondary | ICD-10-CM | POA: Diagnosis not present

## 2014-08-23 DIAGNOSIS — E781 Pure hyperglyceridemia: Secondary | ICD-10-CM | POA: Diagnosis not present

## 2014-08-23 DIAGNOSIS — Z1389 Encounter for screening for other disorder: Secondary | ICD-10-CM | POA: Diagnosis not present

## 2014-08-23 DIAGNOSIS — F419 Anxiety disorder, unspecified: Secondary | ICD-10-CM | POA: Diagnosis not present

## 2014-08-23 DIAGNOSIS — Z23 Encounter for immunization: Secondary | ICD-10-CM | POA: Diagnosis not present

## 2014-08-23 DIAGNOSIS — M109 Gout, unspecified: Secondary | ICD-10-CM | POA: Diagnosis not present

## 2014-08-23 DIAGNOSIS — M8580 Other specified disorders of bone density and structure, unspecified site: Secondary | ICD-10-CM | POA: Diagnosis not present

## 2014-08-24 DIAGNOSIS — Z01419 Encounter for gynecological examination (general) (routine) without abnormal findings: Secondary | ICD-10-CM | POA: Diagnosis not present

## 2014-08-24 DIAGNOSIS — N958 Other specified menopausal and perimenopausal disorders: Secondary | ICD-10-CM | POA: Diagnosis not present

## 2014-08-24 DIAGNOSIS — N390 Urinary tract infection, site not specified: Secondary | ICD-10-CM | POA: Diagnosis not present

## 2014-08-24 DIAGNOSIS — M8588 Other specified disorders of bone density and structure, other site: Secondary | ICD-10-CM | POA: Diagnosis not present

## 2014-08-30 DIAGNOSIS — N390 Urinary tract infection, site not specified: Secondary | ICD-10-CM | POA: Diagnosis not present

## 2015-01-25 DIAGNOSIS — Z23 Encounter for immunization: Secondary | ICD-10-CM | POA: Diagnosis not present

## 2015-01-25 DIAGNOSIS — Z0000 Encounter for general adult medical examination without abnormal findings: Secondary | ICD-10-CM | POA: Diagnosis not present

## 2015-01-25 DIAGNOSIS — M5090 Cervical disc disorder, unspecified, unspecified cervical region: Secondary | ICD-10-CM | POA: Diagnosis not present

## 2015-01-25 DIAGNOSIS — Z1389 Encounter for screening for other disorder: Secondary | ICD-10-CM | POA: Diagnosis not present

## 2015-01-25 DIAGNOSIS — F419 Anxiety disorder, unspecified: Secondary | ICD-10-CM | POA: Diagnosis not present

## 2015-01-25 DIAGNOSIS — I10 Essential (primary) hypertension: Secondary | ICD-10-CM | POA: Diagnosis not present

## 2015-01-25 DIAGNOSIS — E039 Hypothyroidism, unspecified: Secondary | ICD-10-CM | POA: Diagnosis not present

## 2015-01-25 DIAGNOSIS — M109 Gout, unspecified: Secondary | ICD-10-CM | POA: Diagnosis not present

## 2015-01-25 DIAGNOSIS — M8580 Other specified disorders of bone density and structure, unspecified site: Secondary | ICD-10-CM | POA: Diagnosis not present

## 2015-01-25 DIAGNOSIS — E781 Pure hyperglyceridemia: Secondary | ICD-10-CM | POA: Diagnosis not present

## 2015-02-26 DIAGNOSIS — J309 Allergic rhinitis, unspecified: Secondary | ICD-10-CM | POA: Diagnosis not present

## 2015-02-26 DIAGNOSIS — I10 Essential (primary) hypertension: Secondary | ICD-10-CM | POA: Diagnosis not present

## 2015-02-27 DIAGNOSIS — H43819 Vitreous degeneration, unspecified eye: Secondary | ICD-10-CM | POA: Diagnosis not present

## 2015-03-28 DIAGNOSIS — Z1231 Encounter for screening mammogram for malignant neoplasm of breast: Secondary | ICD-10-CM | POA: Diagnosis not present

## 2015-07-30 DIAGNOSIS — M109 Gout, unspecified: Secondary | ICD-10-CM | POA: Diagnosis not present

## 2015-07-30 DIAGNOSIS — E039 Hypothyroidism, unspecified: Secondary | ICD-10-CM | POA: Diagnosis not present

## 2015-07-30 DIAGNOSIS — K219 Gastro-esophageal reflux disease without esophagitis: Secondary | ICD-10-CM | POA: Diagnosis not present

## 2015-07-30 DIAGNOSIS — Z23 Encounter for immunization: Secondary | ICD-10-CM | POA: Diagnosis not present

## 2015-07-30 DIAGNOSIS — I10 Essential (primary) hypertension: Secondary | ICD-10-CM | POA: Diagnosis not present

## 2015-07-30 DIAGNOSIS — M5090 Cervical disc disorder, unspecified, unspecified cervical region: Secondary | ICD-10-CM | POA: Diagnosis not present

## 2015-09-05 DIAGNOSIS — Z124 Encounter for screening for malignant neoplasm of cervix: Secondary | ICD-10-CM | POA: Diagnosis not present

## 2015-09-05 DIAGNOSIS — Z6830 Body mass index (BMI) 30.0-30.9, adult: Secondary | ICD-10-CM | POA: Diagnosis not present

## 2015-09-05 DIAGNOSIS — R351 Nocturia: Secondary | ICD-10-CM | POA: Diagnosis not present

## 2015-10-09 DIAGNOSIS — M545 Low back pain: Secondary | ICD-10-CM | POA: Diagnosis not present

## 2015-11-26 DIAGNOSIS — D485 Neoplasm of uncertain behavior of skin: Secondary | ICD-10-CM | POA: Diagnosis not present

## 2015-11-26 DIAGNOSIS — L820 Inflamed seborrheic keratosis: Secondary | ICD-10-CM | POA: Diagnosis not present

## 2015-11-26 DIAGNOSIS — D1724 Benign lipomatous neoplasm of skin and subcutaneous tissue of left leg: Secondary | ICD-10-CM | POA: Diagnosis not present

## 2015-11-26 DIAGNOSIS — D3612 Benign neoplasm of peripheral nerves and autonomic nervous system, upper limb, including shoulder: Secondary | ICD-10-CM | POA: Diagnosis not present

## 2015-11-29 DIAGNOSIS — M545 Low back pain: Secondary | ICD-10-CM | POA: Diagnosis not present

## 2015-11-29 DIAGNOSIS — M5136 Other intervertebral disc degeneration, lumbar region: Secondary | ICD-10-CM | POA: Diagnosis not present

## 2016-01-29 DIAGNOSIS — M109 Gout, unspecified: Secondary | ICD-10-CM | POA: Diagnosis not present

## 2016-01-29 DIAGNOSIS — E063 Autoimmune thyroiditis: Secondary | ICD-10-CM | POA: Diagnosis not present

## 2016-01-29 DIAGNOSIS — Z1389 Encounter for screening for other disorder: Secondary | ICD-10-CM | POA: Diagnosis not present

## 2016-01-29 DIAGNOSIS — E7800 Pure hypercholesterolemia, unspecified: Secondary | ICD-10-CM | POA: Diagnosis not present

## 2016-01-29 DIAGNOSIS — F419 Anxiety disorder, unspecified: Secondary | ICD-10-CM | POA: Diagnosis not present

## 2016-01-29 DIAGNOSIS — K219 Gastro-esophageal reflux disease without esophagitis: Secondary | ICD-10-CM | POA: Diagnosis not present

## 2016-01-29 DIAGNOSIS — E039 Hypothyroidism, unspecified: Secondary | ICD-10-CM | POA: Diagnosis not present

## 2016-01-29 DIAGNOSIS — M8580 Other specified disorders of bone density and structure, unspecified site: Secondary | ICD-10-CM | POA: Diagnosis not present

## 2016-01-29 DIAGNOSIS — Z0000 Encounter for general adult medical examination without abnormal findings: Secondary | ICD-10-CM | POA: Diagnosis not present

## 2016-01-29 DIAGNOSIS — I10 Essential (primary) hypertension: Secondary | ICD-10-CM | POA: Diagnosis not present

## 2016-04-29 DIAGNOSIS — Z1231 Encounter for screening mammogram for malignant neoplasm of breast: Secondary | ICD-10-CM | POA: Diagnosis not present

## 2016-08-27 DIAGNOSIS — Z23 Encounter for immunization: Secondary | ICD-10-CM | POA: Diagnosis not present

## 2016-08-27 DIAGNOSIS — K219 Gastro-esophageal reflux disease without esophagitis: Secondary | ICD-10-CM | POA: Diagnosis not present

## 2016-08-27 DIAGNOSIS — M109 Gout, unspecified: Secondary | ICD-10-CM | POA: Diagnosis not present

## 2016-08-27 DIAGNOSIS — F419 Anxiety disorder, unspecified: Secondary | ICD-10-CM | POA: Diagnosis not present

## 2016-08-27 DIAGNOSIS — E039 Hypothyroidism, unspecified: Secondary | ICD-10-CM | POA: Diagnosis not present

## 2016-08-27 DIAGNOSIS — I10 Essential (primary) hypertension: Secondary | ICD-10-CM | POA: Diagnosis not present

## 2016-09-17 DIAGNOSIS — Z01419 Encounter for gynecological examination (general) (routine) without abnormal findings: Secondary | ICD-10-CM | POA: Diagnosis not present

## 2016-09-17 DIAGNOSIS — Z6830 Body mass index (BMI) 30.0-30.9, adult: Secondary | ICD-10-CM | POA: Diagnosis not present

## 2016-09-17 DIAGNOSIS — N390 Urinary tract infection, site not specified: Secondary | ICD-10-CM | POA: Diagnosis not present

## 2017-01-01 DIAGNOSIS — Z1389 Encounter for screening for other disorder: Secondary | ICD-10-CM | POA: Diagnosis not present

## 2017-01-01 DIAGNOSIS — M8580 Other specified disorders of bone density and structure, unspecified site: Secondary | ICD-10-CM | POA: Diagnosis not present

## 2017-01-01 DIAGNOSIS — M109 Gout, unspecified: Secondary | ICD-10-CM | POA: Diagnosis not present

## 2017-01-01 DIAGNOSIS — E7800 Pure hypercholesterolemia, unspecified: Secondary | ICD-10-CM | POA: Diagnosis not present

## 2017-01-01 DIAGNOSIS — E039 Hypothyroidism, unspecified: Secondary | ICD-10-CM | POA: Diagnosis not present

## 2017-01-01 DIAGNOSIS — F419 Anxiety disorder, unspecified: Secondary | ICD-10-CM | POA: Diagnosis not present

## 2017-01-01 DIAGNOSIS — I10 Essential (primary) hypertension: Secondary | ICD-10-CM | POA: Diagnosis not present

## 2017-01-01 DIAGNOSIS — Z0000 Encounter for general adult medical examination without abnormal findings: Secondary | ICD-10-CM | POA: Diagnosis not present

## 2017-01-01 DIAGNOSIS — M519 Unspecified thoracic, thoracolumbar and lumbosacral intervertebral disc disorder: Secondary | ICD-10-CM | POA: Diagnosis not present

## 2017-01-01 DIAGNOSIS — K219 Gastro-esophageal reflux disease without esophagitis: Secondary | ICD-10-CM | POA: Diagnosis not present

## 2017-01-01 DIAGNOSIS — J309 Allergic rhinitis, unspecified: Secondary | ICD-10-CM | POA: Diagnosis not present

## 2017-01-01 DIAGNOSIS — M5090 Cervical disc disorder, unspecified, unspecified cervical region: Secondary | ICD-10-CM | POA: Diagnosis not present

## 2017-02-12 DIAGNOSIS — M25552 Pain in left hip: Secondary | ICD-10-CM | POA: Diagnosis not present

## 2017-02-12 DIAGNOSIS — M25562 Pain in left knee: Secondary | ICD-10-CM | POA: Diagnosis not present

## 2017-02-12 DIAGNOSIS — G8929 Other chronic pain: Secondary | ICD-10-CM | POA: Diagnosis not present

## 2017-02-26 DIAGNOSIS — L821 Other seborrheic keratosis: Secondary | ICD-10-CM | POA: Diagnosis not present

## 2017-02-26 DIAGNOSIS — L603 Nail dystrophy: Secondary | ICD-10-CM | POA: Diagnosis not present

## 2017-04-28 DIAGNOSIS — H43391 Other vitreous opacities, right eye: Secondary | ICD-10-CM | POA: Diagnosis not present

## 2017-04-28 DIAGNOSIS — H52223 Regular astigmatism, bilateral: Secondary | ICD-10-CM | POA: Diagnosis not present

## 2017-04-28 DIAGNOSIS — H5203 Hypermetropia, bilateral: Secondary | ICD-10-CM | POA: Diagnosis not present

## 2017-04-28 DIAGNOSIS — H43811 Vitreous degeneration, right eye: Secondary | ICD-10-CM | POA: Diagnosis not present

## 2017-04-28 DIAGNOSIS — H524 Presbyopia: Secondary | ICD-10-CM | POA: Diagnosis not present

## 2017-05-01 DIAGNOSIS — L299 Pruritus, unspecified: Secondary | ICD-10-CM | POA: Diagnosis not present

## 2017-05-01 DIAGNOSIS — H93293 Other abnormal auditory perceptions, bilateral: Secondary | ICD-10-CM | POA: Diagnosis not present

## 2017-05-01 DIAGNOSIS — H6123 Impacted cerumen, bilateral: Secondary | ICD-10-CM | POA: Diagnosis not present

## 2017-05-01 DIAGNOSIS — H938X3 Other specified disorders of ear, bilateral: Secondary | ICD-10-CM | POA: Diagnosis not present

## 2017-05-13 DIAGNOSIS — Z1231 Encounter for screening mammogram for malignant neoplasm of breast: Secondary | ICD-10-CM | POA: Diagnosis not present

## 2017-07-08 DIAGNOSIS — E039 Hypothyroidism, unspecified: Secondary | ICD-10-CM | POA: Diagnosis not present

## 2017-07-08 DIAGNOSIS — E781 Pure hyperglyceridemia: Secondary | ICD-10-CM | POA: Diagnosis not present

## 2017-07-08 DIAGNOSIS — K219 Gastro-esophageal reflux disease without esophagitis: Secondary | ICD-10-CM | POA: Diagnosis not present

## 2017-07-08 DIAGNOSIS — I10 Essential (primary) hypertension: Secondary | ICD-10-CM | POA: Diagnosis not present

## 2017-08-17 DIAGNOSIS — J309 Allergic rhinitis, unspecified: Secondary | ICD-10-CM | POA: Diagnosis not present

## 2017-08-17 DIAGNOSIS — K219 Gastro-esophageal reflux disease without esophagitis: Secondary | ICD-10-CM | POA: Diagnosis not present

## 2017-08-17 DIAGNOSIS — E781 Pure hyperglyceridemia: Secondary | ICD-10-CM | POA: Diagnosis not present

## 2017-08-17 DIAGNOSIS — I10 Essential (primary) hypertension: Secondary | ICD-10-CM | POA: Diagnosis not present

## 2017-08-17 DIAGNOSIS — F419 Anxiety disorder, unspecified: Secondary | ICD-10-CM | POA: Diagnosis not present

## 2017-08-17 DIAGNOSIS — Z23 Encounter for immunization: Secondary | ICD-10-CM | POA: Diagnosis not present

## 2017-08-17 DIAGNOSIS — E039 Hypothyroidism, unspecified: Secondary | ICD-10-CM | POA: Diagnosis not present

## 2017-08-27 DIAGNOSIS — E781 Pure hyperglyceridemia: Secondary | ICD-10-CM | POA: Diagnosis not present

## 2017-08-27 DIAGNOSIS — I10 Essential (primary) hypertension: Secondary | ICD-10-CM | POA: Diagnosis not present

## 2017-08-27 DIAGNOSIS — E039 Hypothyroidism, unspecified: Secondary | ICD-10-CM | POA: Diagnosis not present

## 2017-09-29 DIAGNOSIS — Z6830 Body mass index (BMI) 30.0-30.9, adult: Secondary | ICD-10-CM | POA: Diagnosis not present

## 2017-09-29 DIAGNOSIS — Z01419 Encounter for gynecological examination (general) (routine) without abnormal findings: Secondary | ICD-10-CM | POA: Diagnosis not present

## 2017-09-29 DIAGNOSIS — M8588 Other specified disorders of bone density and structure, other site: Secondary | ICD-10-CM | POA: Diagnosis not present

## 2017-09-29 DIAGNOSIS — N958 Other specified menopausal and perimenopausal disorders: Secondary | ICD-10-CM | POA: Diagnosis not present

## 2017-10-05 DIAGNOSIS — K219 Gastro-esophageal reflux disease without esophagitis: Secondary | ICD-10-CM | POA: Diagnosis not present

## 2017-10-05 DIAGNOSIS — E7800 Pure hypercholesterolemia, unspecified: Secondary | ICD-10-CM | POA: Diagnosis not present

## 2017-10-05 DIAGNOSIS — M109 Gout, unspecified: Secondary | ICD-10-CM | POA: Diagnosis not present

## 2017-10-05 DIAGNOSIS — I10 Essential (primary) hypertension: Secondary | ICD-10-CM | POA: Diagnosis not present

## 2017-10-05 DIAGNOSIS — F419 Anxiety disorder, unspecified: Secondary | ICD-10-CM | POA: Diagnosis not present

## 2017-10-05 DIAGNOSIS — E039 Hypothyroidism, unspecified: Secondary | ICD-10-CM | POA: Diagnosis not present

## 2017-10-05 DIAGNOSIS — M8580 Other specified disorders of bone density and structure, unspecified site: Secondary | ICD-10-CM | POA: Diagnosis not present

## 2018-01-06 DIAGNOSIS — K219 Gastro-esophageal reflux disease without esophagitis: Secondary | ICD-10-CM | POA: Diagnosis not present

## 2018-01-06 DIAGNOSIS — M8580 Other specified disorders of bone density and structure, unspecified site: Secondary | ICD-10-CM | POA: Diagnosis not present

## 2018-01-06 DIAGNOSIS — E7800 Pure hypercholesterolemia, unspecified: Secondary | ICD-10-CM | POA: Diagnosis not present

## 2018-01-06 DIAGNOSIS — Z0000 Encounter for general adult medical examination without abnormal findings: Secondary | ICD-10-CM | POA: Diagnosis not present

## 2018-01-06 DIAGNOSIS — J309 Allergic rhinitis, unspecified: Secondary | ICD-10-CM | POA: Diagnosis not present

## 2018-01-06 DIAGNOSIS — M519 Unspecified thoracic, thoracolumbar and lumbosacral intervertebral disc disorder: Secondary | ICD-10-CM | POA: Diagnosis not present

## 2018-01-06 DIAGNOSIS — M109 Gout, unspecified: Secondary | ICD-10-CM | POA: Diagnosis not present

## 2018-01-06 DIAGNOSIS — E039 Hypothyroidism, unspecified: Secondary | ICD-10-CM | POA: Diagnosis not present

## 2018-01-06 DIAGNOSIS — I10 Essential (primary) hypertension: Secondary | ICD-10-CM | POA: Diagnosis not present

## 2018-01-06 DIAGNOSIS — Z1389 Encounter for screening for other disorder: Secondary | ICD-10-CM | POA: Diagnosis not present

## 2018-05-28 DIAGNOSIS — S39013A Strain of muscle, fascia and tendon of pelvis, initial encounter: Secondary | ICD-10-CM | POA: Diagnosis not present

## 2018-07-14 DIAGNOSIS — E039 Hypothyroidism, unspecified: Secondary | ICD-10-CM | POA: Diagnosis not present

## 2018-07-14 DIAGNOSIS — M109 Gout, unspecified: Secondary | ICD-10-CM | POA: Diagnosis not present

## 2018-07-14 DIAGNOSIS — R7309 Other abnormal glucose: Secondary | ICD-10-CM | POA: Diagnosis not present

## 2018-07-14 DIAGNOSIS — K219 Gastro-esophageal reflux disease without esophagitis: Secondary | ICD-10-CM | POA: Diagnosis not present

## 2018-07-14 DIAGNOSIS — J309 Allergic rhinitis, unspecified: Secondary | ICD-10-CM | POA: Diagnosis not present

## 2018-07-14 DIAGNOSIS — I10 Essential (primary) hypertension: Secondary | ICD-10-CM | POA: Diagnosis not present

## 2018-08-12 DIAGNOSIS — E039 Hypothyroidism, unspecified: Secondary | ICD-10-CM | POA: Diagnosis not present

## 2018-09-09 DIAGNOSIS — H52223 Regular astigmatism, bilateral: Secondary | ICD-10-CM | POA: Diagnosis not present

## 2018-09-09 DIAGNOSIS — H5203 Hypermetropia, bilateral: Secondary | ICD-10-CM | POA: Diagnosis not present

## 2018-09-09 DIAGNOSIS — H25813 Combined forms of age-related cataract, bilateral: Secondary | ICD-10-CM | POA: Diagnosis not present

## 2018-09-09 DIAGNOSIS — H524 Presbyopia: Secondary | ICD-10-CM | POA: Diagnosis not present

## 2018-10-01 DIAGNOSIS — Z23 Encounter for immunization: Secondary | ICD-10-CM | POA: Diagnosis not present

## 2018-10-06 DIAGNOSIS — Z6829 Body mass index (BMI) 29.0-29.9, adult: Secondary | ICD-10-CM | POA: Diagnosis not present

## 2018-10-06 DIAGNOSIS — Z124 Encounter for screening for malignant neoplasm of cervix: Secondary | ICD-10-CM | POA: Diagnosis not present

## 2018-10-06 DIAGNOSIS — Z1231 Encounter for screening mammogram for malignant neoplasm of breast: Secondary | ICD-10-CM | POA: Diagnosis not present

## 2019-02-03 DIAGNOSIS — I10 Essential (primary) hypertension: Secondary | ICD-10-CM | POA: Diagnosis not present

## 2019-02-03 DIAGNOSIS — M109 Gout, unspecified: Secondary | ICD-10-CM | POA: Diagnosis not present

## 2019-02-03 DIAGNOSIS — Z0000 Encounter for general adult medical examination without abnormal findings: Secondary | ICD-10-CM | POA: Diagnosis not present

## 2019-02-03 DIAGNOSIS — Z1389 Encounter for screening for other disorder: Secondary | ICD-10-CM | POA: Diagnosis not present

## 2019-02-03 DIAGNOSIS — E039 Hypothyroidism, unspecified: Secondary | ICD-10-CM | POA: Diagnosis not present

## 2019-02-03 DIAGNOSIS — M8580 Other specified disorders of bone density and structure, unspecified site: Secondary | ICD-10-CM | POA: Diagnosis not present

## 2019-02-03 DIAGNOSIS — E781 Pure hyperglyceridemia: Secondary | ICD-10-CM | POA: Diagnosis not present

## 2019-02-03 DIAGNOSIS — J309 Allergic rhinitis, unspecified: Secondary | ICD-10-CM | POA: Diagnosis not present

## 2019-02-09 DIAGNOSIS — E039 Hypothyroidism, unspecified: Secondary | ICD-10-CM | POA: Diagnosis not present

## 2019-02-09 DIAGNOSIS — M109 Gout, unspecified: Secondary | ICD-10-CM | POA: Diagnosis not present

## 2019-02-09 DIAGNOSIS — I10 Essential (primary) hypertension: Secondary | ICD-10-CM | POA: Diagnosis not present

## 2019-02-09 DIAGNOSIS — E781 Pure hyperglyceridemia: Secondary | ICD-10-CM | POA: Diagnosis not present

## 2019-04-19 DIAGNOSIS — E039 Hypothyroidism, unspecified: Secondary | ICD-10-CM | POA: Diagnosis not present

## 2019-06-16 DIAGNOSIS — M8580 Other specified disorders of bone density and structure, unspecified site: Secondary | ICD-10-CM | POA: Diagnosis not present

## 2019-06-16 DIAGNOSIS — E039 Hypothyroidism, unspecified: Secondary | ICD-10-CM | POA: Diagnosis not present

## 2019-06-16 DIAGNOSIS — I10 Essential (primary) hypertension: Secondary | ICD-10-CM | POA: Diagnosis not present

## 2019-06-16 DIAGNOSIS — E781 Pure hyperglyceridemia: Secondary | ICD-10-CM | POA: Diagnosis not present

## 2019-08-02 DIAGNOSIS — E781 Pure hyperglyceridemia: Secondary | ICD-10-CM | POA: Diagnosis not present

## 2019-08-02 DIAGNOSIS — M8580 Other specified disorders of bone density and structure, unspecified site: Secondary | ICD-10-CM | POA: Diagnosis not present

## 2019-08-02 DIAGNOSIS — E7800 Pure hypercholesterolemia, unspecified: Secondary | ICD-10-CM | POA: Diagnosis not present

## 2019-08-02 DIAGNOSIS — E039 Hypothyroidism, unspecified: Secondary | ICD-10-CM | POA: Diagnosis not present

## 2019-08-02 DIAGNOSIS — I10 Essential (primary) hypertension: Secondary | ICD-10-CM | POA: Diagnosis not present

## 2019-08-10 DIAGNOSIS — E039 Hypothyroidism, unspecified: Secondary | ICD-10-CM | POA: Diagnosis not present

## 2019-08-10 DIAGNOSIS — Z23 Encounter for immunization: Secondary | ICD-10-CM | POA: Diagnosis not present

## 2019-08-10 DIAGNOSIS — F419 Anxiety disorder, unspecified: Secondary | ICD-10-CM | POA: Diagnosis not present

## 2019-08-10 DIAGNOSIS — M109 Gout, unspecified: Secondary | ICD-10-CM | POA: Diagnosis not present

## 2019-08-10 DIAGNOSIS — E7800 Pure hypercholesterolemia, unspecified: Secondary | ICD-10-CM | POA: Diagnosis not present

## 2019-08-10 DIAGNOSIS — M519 Unspecified thoracic, thoracolumbar and lumbosacral intervertebral disc disorder: Secondary | ICD-10-CM | POA: Diagnosis not present

## 2019-08-10 DIAGNOSIS — K219 Gastro-esophageal reflux disease without esophagitis: Secondary | ICD-10-CM | POA: Diagnosis not present

## 2019-08-10 DIAGNOSIS — J309 Allergic rhinitis, unspecified: Secondary | ICD-10-CM | POA: Diagnosis not present

## 2019-08-10 DIAGNOSIS — I10 Essential (primary) hypertension: Secondary | ICD-10-CM | POA: Diagnosis not present

## 2019-11-10 DIAGNOSIS — Z1231 Encounter for screening mammogram for malignant neoplasm of breast: Secondary | ICD-10-CM | POA: Diagnosis not present

## 2019-11-10 DIAGNOSIS — Z01419 Encounter for gynecological examination (general) (routine) without abnormal findings: Secondary | ICD-10-CM | POA: Diagnosis not present

## 2019-11-10 DIAGNOSIS — Z6829 Body mass index (BMI) 29.0-29.9, adult: Secondary | ICD-10-CM | POA: Diagnosis not present

## 2019-11-16 DIAGNOSIS — I10 Essential (primary) hypertension: Secondary | ICD-10-CM | POA: Diagnosis not present

## 2019-11-16 DIAGNOSIS — E7800 Pure hypercholesterolemia, unspecified: Secondary | ICD-10-CM | POA: Diagnosis not present

## 2019-11-16 DIAGNOSIS — E781 Pure hyperglyceridemia: Secondary | ICD-10-CM | POA: Diagnosis not present

## 2019-11-16 DIAGNOSIS — E039 Hypothyroidism, unspecified: Secondary | ICD-10-CM | POA: Diagnosis not present

## 2019-11-16 DIAGNOSIS — M8580 Other specified disorders of bone density and structure, unspecified site: Secondary | ICD-10-CM | POA: Diagnosis not present

## 2019-12-06 DIAGNOSIS — E781 Pure hyperglyceridemia: Secondary | ICD-10-CM | POA: Diagnosis not present

## 2019-12-06 DIAGNOSIS — E7800 Pure hypercholesterolemia, unspecified: Secondary | ICD-10-CM | POA: Diagnosis not present

## 2019-12-06 DIAGNOSIS — E039 Hypothyroidism, unspecified: Secondary | ICD-10-CM | POA: Diagnosis not present

## 2019-12-06 DIAGNOSIS — I10 Essential (primary) hypertension: Secondary | ICD-10-CM | POA: Diagnosis not present

## 2019-12-06 DIAGNOSIS — M8580 Other specified disorders of bone density and structure, unspecified site: Secondary | ICD-10-CM | POA: Diagnosis not present

## 2019-12-30 ENCOUNTER — Ambulatory Visit: Payer: Medicare Other | Attending: Internal Medicine

## 2019-12-30 DIAGNOSIS — Z23 Encounter for immunization: Secondary | ICD-10-CM | POA: Insufficient documentation

## 2019-12-30 NOTE — Progress Notes (Signed)
   Covid-19 Vaccination Clinic  Name:  Katrina Wall    MRN: [id number] DOB: 04/29/1941  12/30/2019  [name] was observed post Covid-19 immunization for 15 minutes without incidence. She was provided with Vaccine Information Sheet and instruction to access the V-Safe system.   [name] was instructed to call 911 with any severe reactions post vaccine: Marland Kitchen Difficulty breathing  . Swelling of your face and throat  . A fast heartbeat  . A bad rash all over your body  . Dizziness and weakness    Immunizations Administered    Name Date Dose VIS Date Route   Pfizer COVID-19 Vaccine 12/30/2019  1:57 PM 0.3 mL 10/14/2019 Intramuscular   Manufacturer: [place]   Lot: [id number]   [place]: [id number]

## 2020-01-19 DIAGNOSIS — E039 Hypothyroidism, unspecified: Secondary | ICD-10-CM | POA: Diagnosis not present

## 2020-01-19 DIAGNOSIS — E7800 Pure hypercholesterolemia, unspecified: Secondary | ICD-10-CM | POA: Diagnosis not present

## 2020-01-19 DIAGNOSIS — E781 Pure hyperglyceridemia: Secondary | ICD-10-CM | POA: Diagnosis not present

## 2020-01-19 DIAGNOSIS — M8580 Other specified disorders of bone density and structure, unspecified site: Secondary | ICD-10-CM | POA: Diagnosis not present

## 2020-01-19 DIAGNOSIS — I10 Essential (primary) hypertension: Secondary | ICD-10-CM | POA: Diagnosis not present

## 2020-01-25 ENCOUNTER — Ambulatory Visit: Payer: Medicare Other | Attending: Internal Medicine

## 2020-01-25 DIAGNOSIS — Z23 Encounter for immunization: Secondary | ICD-10-CM

## 2020-01-25 NOTE — Progress Notes (Signed)
   Covid-19 Vaccination Clinic  Name:  Katrina Wall    MRN: [id number] DOB: 03/03/1941  01/25/2020  [name] was observed post Covid-19 immunization for 15 minutes without incident. She was provided with Vaccine Information Sheet and instruction to access the V-Safe system.   [name] was instructed to call 911 with any severe reactions post vaccine: Marland Kitchen Difficulty breathing  . Swelling of face and throat  . A fast heartbeat  . A bad rash all over body  . Dizziness and weakness   Immunizations Administered    Name Date Dose VIS Date Route   Pfizer COVID-19 Vaccine 01/25/2020  1:25 PM 0.3 mL 10/14/2019 Intramuscular   Manufacturer: [place]   Lot: [id number]   [place]: [id number]

## 2020-02-06 DIAGNOSIS — I10 Essential (primary) hypertension: Secondary | ICD-10-CM | POA: Diagnosis not present

## 2020-02-06 DIAGNOSIS — J309 Allergic rhinitis, unspecified: Secondary | ICD-10-CM | POA: Diagnosis not present

## 2020-02-06 DIAGNOSIS — M8580 Other specified disorders of bone density and structure, unspecified site: Secondary | ICD-10-CM | POA: Diagnosis not present

## 2020-02-06 DIAGNOSIS — Z0000 Encounter for general adult medical examination without abnormal findings: Secondary | ICD-10-CM | POA: Diagnosis not present

## 2020-02-06 DIAGNOSIS — M109 Gout, unspecified: Secondary | ICD-10-CM | POA: Diagnosis not present

## 2020-02-06 DIAGNOSIS — E039 Hypothyroidism, unspecified: Secondary | ICD-10-CM | POA: Diagnosis not present

## 2020-02-06 DIAGNOSIS — M519 Unspecified thoracic, thoracolumbar and lumbosacral intervertebral disc disorder: Secondary | ICD-10-CM | POA: Diagnosis not present

## 2020-02-06 DIAGNOSIS — Z1389 Encounter for screening for other disorder: Secondary | ICD-10-CM | POA: Diagnosis not present

## 2020-02-06 DIAGNOSIS — F419 Anxiety disorder, unspecified: Secondary | ICD-10-CM | POA: Diagnosis not present

## 2020-02-06 DIAGNOSIS — R7309 Other abnormal glucose: Secondary | ICD-10-CM | POA: Diagnosis not present

## 2020-02-06 DIAGNOSIS — E781 Pure hyperglyceridemia: Secondary | ICD-10-CM | POA: Diagnosis not present

## 2020-02-29 DIAGNOSIS — I10 Essential (primary) hypertension: Secondary | ICD-10-CM | POA: Diagnosis not present

## 2020-02-29 DIAGNOSIS — M8580 Other specified disorders of bone density and structure, unspecified site: Secondary | ICD-10-CM | POA: Diagnosis not present

## 2020-02-29 DIAGNOSIS — E039 Hypothyroidism, unspecified: Secondary | ICD-10-CM | POA: Diagnosis not present

## 2020-02-29 DIAGNOSIS — E7800 Pure hypercholesterolemia, unspecified: Secondary | ICD-10-CM | POA: Diagnosis not present

## 2020-02-29 DIAGNOSIS — E781 Pure hyperglyceridemia: Secondary | ICD-10-CM | POA: Diagnosis not present

## 2020-03-07 DIAGNOSIS — L989 Disorder of the skin and subcutaneous tissue, unspecified: Secondary | ICD-10-CM | POA: Diagnosis not present

## 2020-03-07 DIAGNOSIS — S30861A Insect bite (nonvenomous) of abdominal wall, initial encounter: Secondary | ICD-10-CM | POA: Diagnosis not present

## 2020-03-07 DIAGNOSIS — Z23 Encounter for immunization: Secondary | ICD-10-CM | POA: Diagnosis not present

## 2020-04-25 DIAGNOSIS — M8580 Other specified disorders of bone density and structure, unspecified site: Secondary | ICD-10-CM | POA: Diagnosis not present

## 2020-04-25 DIAGNOSIS — I10 Essential (primary) hypertension: Secondary | ICD-10-CM | POA: Diagnosis not present

## 2020-04-25 DIAGNOSIS — E039 Hypothyroidism, unspecified: Secondary | ICD-10-CM | POA: Diagnosis not present

## 2020-04-25 DIAGNOSIS — E781 Pure hyperglyceridemia: Secondary | ICD-10-CM | POA: Diagnosis not present

## 2020-04-25 DIAGNOSIS — E7800 Pure hypercholesterolemia, unspecified: Secondary | ICD-10-CM | POA: Diagnosis not present

## 2020-06-14 DIAGNOSIS — E7800 Pure hypercholesterolemia, unspecified: Secondary | ICD-10-CM | POA: Diagnosis not present

## 2020-06-14 DIAGNOSIS — E781 Pure hyperglyceridemia: Secondary | ICD-10-CM | POA: Diagnosis not present

## 2020-06-14 DIAGNOSIS — E039 Hypothyroidism, unspecified: Secondary | ICD-10-CM | POA: Diagnosis not present

## 2020-06-14 DIAGNOSIS — I10 Essential (primary) hypertension: Secondary | ICD-10-CM | POA: Diagnosis not present

## 2020-06-14 DIAGNOSIS — M8580 Other specified disorders of bone density and structure, unspecified site: Secondary | ICD-10-CM | POA: Diagnosis not present

## 2020-07-18 DIAGNOSIS — E781 Pure hyperglyceridemia: Secondary | ICD-10-CM | POA: Diagnosis not present

## 2020-07-18 DIAGNOSIS — E039 Hypothyroidism, unspecified: Secondary | ICD-10-CM | POA: Diagnosis not present

## 2020-07-18 DIAGNOSIS — E7800 Pure hypercholesterolemia, unspecified: Secondary | ICD-10-CM | POA: Diagnosis not present

## 2020-07-18 DIAGNOSIS — I10 Essential (primary) hypertension: Secondary | ICD-10-CM | POA: Diagnosis not present

## 2020-07-18 DIAGNOSIS — M8580 Other specified disorders of bone density and structure, unspecified site: Secondary | ICD-10-CM | POA: Diagnosis not present

## 2020-08-14 DIAGNOSIS — J309 Allergic rhinitis, unspecified: Secondary | ICD-10-CM | POA: Diagnosis not present

## 2020-08-14 DIAGNOSIS — K219 Gastro-esophageal reflux disease without esophagitis: Secondary | ICD-10-CM | POA: Diagnosis not present

## 2020-08-14 DIAGNOSIS — F419 Anxiety disorder, unspecified: Secondary | ICD-10-CM | POA: Diagnosis not present

## 2020-08-14 DIAGNOSIS — M519 Unspecified thoracic, thoracolumbar and lumbosacral intervertebral disc disorder: Secondary | ICD-10-CM | POA: Diagnosis not present

## 2020-08-14 DIAGNOSIS — M109 Gout, unspecified: Secondary | ICD-10-CM | POA: Diagnosis not present

## 2020-08-14 DIAGNOSIS — Z23 Encounter for immunization: Secondary | ICD-10-CM | POA: Diagnosis not present

## 2020-08-14 DIAGNOSIS — I10 Essential (primary) hypertension: Secondary | ICD-10-CM | POA: Diagnosis not present

## 2020-08-14 DIAGNOSIS — E039 Hypothyroidism, unspecified: Secondary | ICD-10-CM | POA: Diagnosis not present

## 2020-08-14 DIAGNOSIS — E7800 Pure hypercholesterolemia, unspecified: Secondary | ICD-10-CM | POA: Diagnosis not present

## 2020-09-18 DIAGNOSIS — E7800 Pure hypercholesterolemia, unspecified: Secondary | ICD-10-CM | POA: Diagnosis not present

## 2020-09-18 DIAGNOSIS — K219 Gastro-esophageal reflux disease without esophagitis: Secondary | ICD-10-CM | POA: Diagnosis not present

## 2020-09-18 DIAGNOSIS — E781 Pure hyperglyceridemia: Secondary | ICD-10-CM | POA: Diagnosis not present

## 2020-09-18 DIAGNOSIS — E039 Hypothyroidism, unspecified: Secondary | ICD-10-CM | POA: Diagnosis not present

## 2020-09-18 DIAGNOSIS — I10 Essential (primary) hypertension: Secondary | ICD-10-CM | POA: Diagnosis not present

## 2020-09-18 DIAGNOSIS — M8580 Other specified disorders of bone density and structure, unspecified site: Secondary | ICD-10-CM | POA: Diagnosis not present

## 2020-10-29 DIAGNOSIS — E7800 Pure hypercholesterolemia, unspecified: Secondary | ICD-10-CM | POA: Diagnosis not present

## 2020-10-29 DIAGNOSIS — I10 Essential (primary) hypertension: Secondary | ICD-10-CM | POA: Diagnosis not present

## 2020-10-29 DIAGNOSIS — K219 Gastro-esophageal reflux disease without esophagitis: Secondary | ICD-10-CM | POA: Diagnosis not present

## 2020-10-29 DIAGNOSIS — M8580 Other specified disorders of bone density and structure, unspecified site: Secondary | ICD-10-CM | POA: Diagnosis not present

## 2020-10-29 DIAGNOSIS — E039 Hypothyroidism, unspecified: Secondary | ICD-10-CM | POA: Diagnosis not present

## 2020-10-29 DIAGNOSIS — E781 Pure hyperglyceridemia: Secondary | ICD-10-CM | POA: Diagnosis not present

## 2020-11-09 ENCOUNTER — Ambulatory Visit: Payer: Self-pay | Admitting: *Deleted

## 2020-11-09 NOTE — Telephone Encounter (Signed)
  I returned pt's call and answered her question regarding the covid booster after covid exposure. She is fine to get her booster because her quarantine will be done by 11/14/2020 when she is scheduled to get her booster covid vaccine.  (1st booster after the 2 initial vaccinations).  She thanked me for returning her call.  Reason for Disposition . COVID-19 vaccine, Frequently Asked Questions (FAQs)  Answer Assessment - Initial Assessment Questions 1. MAIN CONCERN OR SYMPTOM:  "What is your main concern right now?" "What question do you have?" "What's the main symptom you're worried about?" (e.g., fever, pain, redness, swelling)     Christmas Day I was with family.   My son is positive for covid.   Jan. 3 he got test back and it was positive.   2. VACCINE: "What vaccination did you receive?" (e.g., none; AstraZeneca, J&J, [place], other) "Is this your first, second shot, or booster?" (e.g., first, second, booster)     I'm supposed to get my covid booster on Jan. 12.    Is it ok to get my booster?   I've had my other 2 covid shots. 3. SYMPTOM ONSET: "When did the No symptoms begin?" (e.g., not relevant; hours, days)      N/A 4. SYMPTOM SEVERITY: "How bad is it?"      N/A 5. FEVER: "Is there a fever?" If Yes, ask: "What is it, how was it measured, and when did it start?"      No 6. PAST REACTIONS: "Have you reacted to immunizations before?" If Yes, ask: "What happened?"     no 7. OTHER SYMPTOMS: "Do you have any other symptoms?"     Never developed covid symptoms and I was exposed on Christmas Day.  She will be off of quarantine by 11/14/2020.  Protocols used: CORONAVIRUS (COVID-19) VACCINE QUESTIONS AND REACTIONS-A-AH

## 2020-11-14 ENCOUNTER — Ambulatory Visit: Payer: 59

## 2020-11-14 ENCOUNTER — Other Ambulatory Visit (HOSPITAL_COMMUNITY): Payer: Self-pay | Admitting: Internal Medicine

## 2020-11-14 ENCOUNTER — Ambulatory Visit: Payer: Medicare Other | Attending: Internal Medicine

## 2020-11-14 DIAGNOSIS — Z23 Encounter for immunization: Secondary | ICD-10-CM

## 2020-11-14 NOTE — Progress Notes (Signed)
   Covid-19 Vaccination Clinic  Name:  Katrina Wall    MRN: [id number] DOB: Aug 09, 1941  11/14/2020  [name] was observed post Covid-19 immunization for 15 minutes without incident. She was provided with Vaccine Information Sheet and instruction to access the V-Safe system.   [name] was instructed to call 911 with any severe reactions post vaccine: Marland Kitchen Difficulty breathing  . Swelling of face and throat  . A fast heartbeat  . A bad rash all over body  . Dizziness and weakness   Immunizations Administered    Name Date Dose VIS Date Route   Pfizer COVID-19 Vaccine 11/14/2020 11:56 AM 0.3 mL 08/22/2020 Intramuscular   Manufacturer: [place]   Lot: [id number]   NDC: [address]-8

## 2020-12-03 DIAGNOSIS — E039 Hypothyroidism, unspecified: Secondary | ICD-10-CM | POA: Diagnosis not present

## 2020-12-03 DIAGNOSIS — E7800 Pure hypercholesterolemia, unspecified: Secondary | ICD-10-CM | POA: Diagnosis not present

## 2020-12-03 DIAGNOSIS — M8580 Other specified disorders of bone density and structure, unspecified site: Secondary | ICD-10-CM | POA: Diagnosis not present

## 2020-12-03 DIAGNOSIS — E781 Pure hyperglyceridemia: Secondary | ICD-10-CM | POA: Diagnosis not present

## 2020-12-03 DIAGNOSIS — K219 Gastro-esophageal reflux disease without esophagitis: Secondary | ICD-10-CM | POA: Diagnosis not present

## 2020-12-03 DIAGNOSIS — I10 Essential (primary) hypertension: Secondary | ICD-10-CM | POA: Diagnosis not present

## 2021-01-02 DIAGNOSIS — M545 Low back pain, unspecified: Secondary | ICD-10-CM | POA: Diagnosis not present

## 2021-01-02 DIAGNOSIS — M8580 Other specified disorders of bone density and structure, unspecified site: Secondary | ICD-10-CM | POA: Diagnosis not present

## 2021-01-09 DIAGNOSIS — I10 Essential (primary) hypertension: Secondary | ICD-10-CM | POA: Diagnosis not present

## 2021-01-09 DIAGNOSIS — M8580 Other specified disorders of bone density and structure, unspecified site: Secondary | ICD-10-CM | POA: Diagnosis not present

## 2021-01-09 DIAGNOSIS — E039 Hypothyroidism, unspecified: Secondary | ICD-10-CM | POA: Diagnosis not present

## 2021-01-09 DIAGNOSIS — E781 Pure hyperglyceridemia: Secondary | ICD-10-CM | POA: Diagnosis not present

## 2021-01-09 DIAGNOSIS — E7800 Pure hypercholesterolemia, unspecified: Secondary | ICD-10-CM | POA: Diagnosis not present

## 2021-01-09 DIAGNOSIS — K219 Gastro-esophageal reflux disease without esophagitis: Secondary | ICD-10-CM | POA: Diagnosis not present

## 2021-02-12 DIAGNOSIS — M859 Disorder of bone density and structure, unspecified: Secondary | ICD-10-CM | POA: Diagnosis not present

## 2021-02-12 DIAGNOSIS — I10 Essential (primary) hypertension: Secondary | ICD-10-CM | POA: Diagnosis not present

## 2021-02-12 DIAGNOSIS — M5090 Cervical disc disorder, unspecified, unspecified cervical region: Secondary | ICD-10-CM | POA: Diagnosis not present

## 2021-02-12 DIAGNOSIS — E7800 Pure hypercholesterolemia, unspecified: Secondary | ICD-10-CM | POA: Diagnosis not present

## 2021-02-12 DIAGNOSIS — K219 Gastro-esophageal reflux disease without esophagitis: Secondary | ICD-10-CM | POA: Diagnosis not present

## 2021-02-12 DIAGNOSIS — M8580 Other specified disorders of bone density and structure, unspecified site: Secondary | ICD-10-CM | POA: Diagnosis not present

## 2021-02-12 DIAGNOSIS — Z0000 Encounter for general adult medical examination without abnormal findings: Secondary | ICD-10-CM | POA: Diagnosis not present

## 2021-02-12 DIAGNOSIS — M109 Gout, unspecified: Secondary | ICD-10-CM | POA: Diagnosis not present

## 2021-02-12 DIAGNOSIS — E039 Hypothyroidism, unspecified: Secondary | ICD-10-CM | POA: Diagnosis not present

## 2021-02-14 DIAGNOSIS — H524 Presbyopia: Secondary | ICD-10-CM | POA: Diagnosis not present

## 2021-02-14 DIAGNOSIS — H25011 Cortical age-related cataract, right eye: Secondary | ICD-10-CM | POA: Diagnosis not present

## 2021-03-20 DIAGNOSIS — K219 Gastro-esophageal reflux disease without esophagitis: Secondary | ICD-10-CM | POA: Diagnosis not present

## 2021-03-20 DIAGNOSIS — E781 Pure hyperglyceridemia: Secondary | ICD-10-CM | POA: Diagnosis not present

## 2021-03-20 DIAGNOSIS — N958 Other specified menopausal and perimenopausal disorders: Secondary | ICD-10-CM | POA: Diagnosis not present

## 2021-03-20 DIAGNOSIS — E039 Hypothyroidism, unspecified: Secondary | ICD-10-CM | POA: Diagnosis not present

## 2021-03-20 DIAGNOSIS — Z6828 Body mass index (BMI) 28.0-28.9, adult: Secondary | ICD-10-CM | POA: Diagnosis not present

## 2021-03-20 DIAGNOSIS — I10 Essential (primary) hypertension: Secondary | ICD-10-CM | POA: Diagnosis not present

## 2021-03-20 DIAGNOSIS — E7800 Pure hypercholesterolemia, unspecified: Secondary | ICD-10-CM | POA: Diagnosis not present

## 2021-03-20 DIAGNOSIS — Z124 Encounter for screening for malignant neoplasm of cervix: Secondary | ICD-10-CM | POA: Diagnosis not present

## 2021-03-20 DIAGNOSIS — M8580 Other specified disorders of bone density and structure, unspecified site: Secondary | ICD-10-CM | POA: Diagnosis not present

## 2021-03-20 DIAGNOSIS — M8588 Other specified disorders of bone density and structure, other site: Secondary | ICD-10-CM | POA: Diagnosis not present

## 2021-03-20 DIAGNOSIS — Z1231 Encounter for screening mammogram for malignant neoplasm of breast: Secondary | ICD-10-CM | POA: Diagnosis not present

## 2021-04-08 ENCOUNTER — Ambulatory Visit
Admission: RE | Admit: 2021-04-08 | Discharge: 2021-04-08 | Disposition: A | Discharge status: Home / Self Care | Payer: Medicare Other | Source: Ambulatory Visit | Attending: Internal Medicine | Admitting: Internal Medicine

## 2021-04-08 ENCOUNTER — Other Ambulatory Visit: Payer: Self-pay | Admitting: Internal Medicine

## 2021-04-08 DIAGNOSIS — R0789 Other chest pain: Secondary | ICD-10-CM

## 2021-04-08 DIAGNOSIS — R079 Chest pain, unspecified: Secondary | ICD-10-CM | POA: Diagnosis not present

## 2021-06-06 DIAGNOSIS — E039 Hypothyroidism, unspecified: Secondary | ICD-10-CM | POA: Diagnosis not present

## 2021-06-06 DIAGNOSIS — M8580 Other specified disorders of bone density and structure, unspecified site: Secondary | ICD-10-CM | POA: Diagnosis not present

## 2021-06-06 DIAGNOSIS — E781 Pure hyperglyceridemia: Secondary | ICD-10-CM | POA: Diagnosis not present

## 2021-06-06 DIAGNOSIS — K219 Gastro-esophageal reflux disease without esophagitis: Secondary | ICD-10-CM | POA: Diagnosis not present

## 2021-06-06 DIAGNOSIS — E7800 Pure hypercholesterolemia, unspecified: Secondary | ICD-10-CM | POA: Diagnosis not present

## 2021-06-06 DIAGNOSIS — I10 Essential (primary) hypertension: Secondary | ICD-10-CM | POA: Diagnosis not present

## 2021-08-14 DIAGNOSIS — E7800 Pure hypercholesterolemia, unspecified: Secondary | ICD-10-CM | POA: Diagnosis not present

## 2021-08-14 DIAGNOSIS — M109 Gout, unspecified: Secondary | ICD-10-CM | POA: Diagnosis not present

## 2021-08-14 DIAGNOSIS — M8580 Other specified disorders of bone density and structure, unspecified site: Secondary | ICD-10-CM | POA: Diagnosis not present

## 2021-08-14 DIAGNOSIS — E039 Hypothyroidism, unspecified: Secondary | ICD-10-CM | POA: Diagnosis not present

## 2021-08-14 DIAGNOSIS — Z23 Encounter for immunization: Secondary | ICD-10-CM | POA: Diagnosis not present

## 2021-08-14 DIAGNOSIS — I10 Essential (primary) hypertension: Secondary | ICD-10-CM | POA: Diagnosis not present

## 2021-08-26 DIAGNOSIS — H5203 Hypermetropia, bilateral: Secondary | ICD-10-CM | POA: Diagnosis not present

## 2021-08-26 DIAGNOSIS — H2513 Age-related nuclear cataract, bilateral: Secondary | ICD-10-CM | POA: Diagnosis not present

## 2021-08-26 DIAGNOSIS — H52203 Unspecified astigmatism, bilateral: Secondary | ICD-10-CM | POA: Diagnosis not present

## 2021-08-26 DIAGNOSIS — H524 Presbyopia: Secondary | ICD-10-CM | POA: Diagnosis not present

## 2021-10-18 DIAGNOSIS — E7800 Pure hypercholesterolemia, unspecified: Secondary | ICD-10-CM | POA: Diagnosis not present

## 2021-10-18 DIAGNOSIS — K219 Gastro-esophageal reflux disease without esophagitis: Secondary | ICD-10-CM | POA: Diagnosis not present

## 2021-10-18 DIAGNOSIS — I10 Essential (primary) hypertension: Secondary | ICD-10-CM | POA: Diagnosis not present

## 2021-10-18 DIAGNOSIS — M8580 Other specified disorders of bone density and structure, unspecified site: Secondary | ICD-10-CM | POA: Diagnosis not present

## 2021-10-18 DIAGNOSIS — E039 Hypothyroidism, unspecified: Secondary | ICD-10-CM | POA: Diagnosis not present

## 2021-10-18 DIAGNOSIS — E781 Pure hyperglyceridemia: Secondary | ICD-10-CM | POA: Diagnosis not present

## 2021-10-31 DIAGNOSIS — Z23 Encounter for immunization: Secondary | ICD-10-CM | POA: Diagnosis not present

## 2021-11-28 DIAGNOSIS — Z20822 Contact with and (suspected) exposure to covid-19: Secondary | ICD-10-CM | POA: Diagnosis not present

## 2022-02-13 DIAGNOSIS — M109 Gout, unspecified: Secondary | ICD-10-CM | POA: Diagnosis not present

## 2022-02-13 DIAGNOSIS — R7309 Other abnormal glucose: Secondary | ICD-10-CM | POA: Diagnosis not present

## 2022-02-13 DIAGNOSIS — E039 Hypothyroidism, unspecified: Secondary | ICD-10-CM | POA: Diagnosis not present

## 2022-02-13 DIAGNOSIS — M519 Unspecified thoracic, thoracolumbar and lumbosacral intervertebral disc disorder: Secondary | ICD-10-CM | POA: Diagnosis not present

## 2022-02-13 DIAGNOSIS — Z1389 Encounter for screening for other disorder: Secondary | ICD-10-CM | POA: Diagnosis not present

## 2022-02-13 DIAGNOSIS — F419 Anxiety disorder, unspecified: Secondary | ICD-10-CM | POA: Diagnosis not present

## 2022-02-13 DIAGNOSIS — M8580 Other specified disorders of bone density and structure, unspecified site: Secondary | ICD-10-CM | POA: Diagnosis not present

## 2022-02-13 DIAGNOSIS — I10 Essential (primary) hypertension: Secondary | ICD-10-CM | POA: Diagnosis not present

## 2022-02-13 DIAGNOSIS — E7800 Pure hypercholesterolemia, unspecified: Secondary | ICD-10-CM | POA: Diagnosis not present

## 2022-02-13 DIAGNOSIS — Z0000 Encounter for general adult medical examination without abnormal findings: Secondary | ICD-10-CM | POA: Diagnosis not present

## 2022-03-03 DIAGNOSIS — L03116 Cellulitis of left lower limb: Secondary | ICD-10-CM | POA: Diagnosis not present

## 2022-04-18 DIAGNOSIS — I10 Essential (primary) hypertension: Secondary | ICD-10-CM | POA: Diagnosis not present

## 2022-04-18 DIAGNOSIS — E039 Hypothyroidism, unspecified: Secondary | ICD-10-CM | POA: Diagnosis not present

## 2022-04-18 DIAGNOSIS — M8580 Other specified disorders of bone density and structure, unspecified site: Secondary | ICD-10-CM | POA: Diagnosis not present

## 2022-04-18 DIAGNOSIS — E7800 Pure hypercholesterolemia, unspecified: Secondary | ICD-10-CM | POA: Diagnosis not present

## 2022-05-03 IMAGING — DX DG CHEST 2V
2 series · 2 of 2 positions shown · non-contrast
Comparison: None.

CLINICAL DATA: Chest pain.

EXAM:
CHEST - 2 VIEW

[dg chest 2 view (1 of 2)]
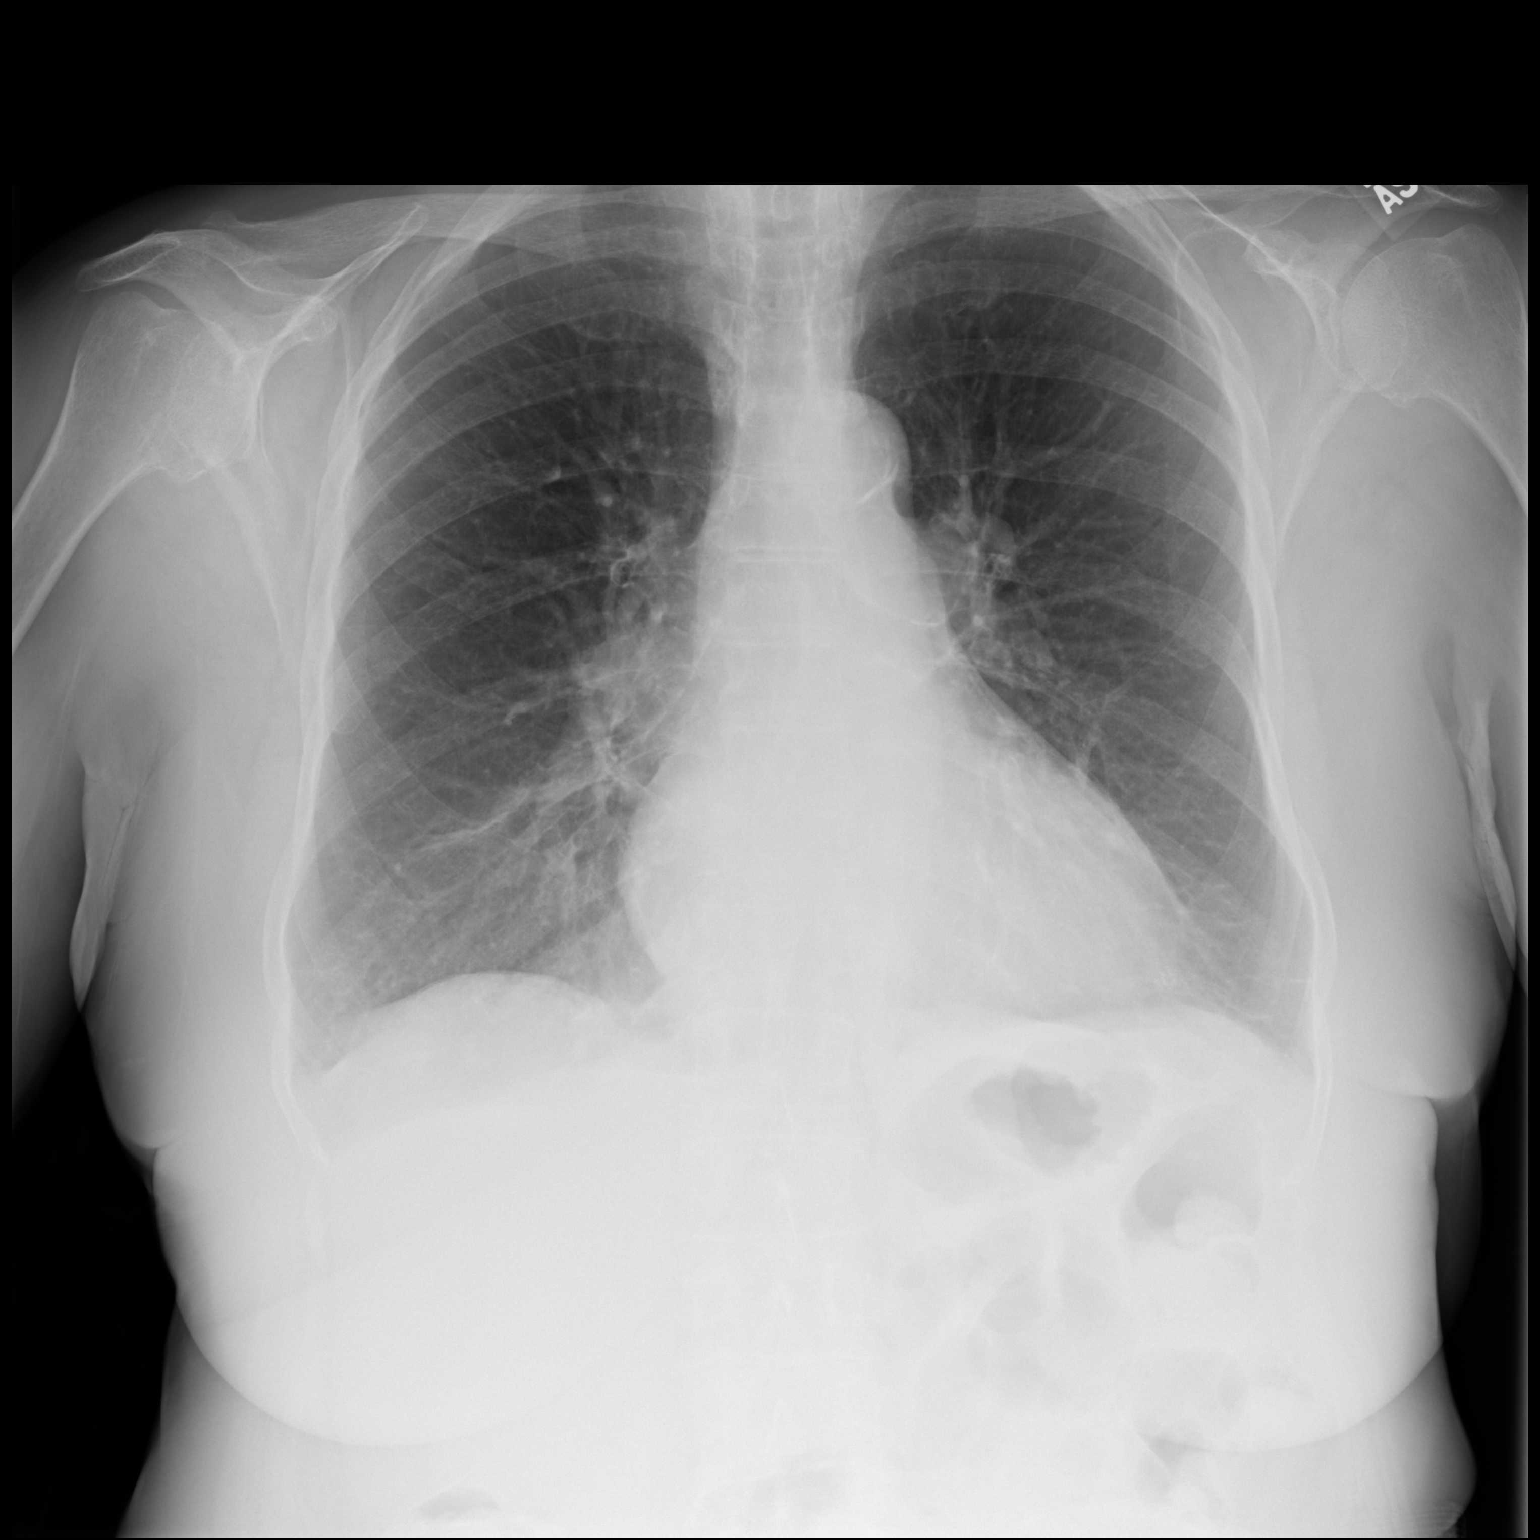

[dg chest 2 view (2 of 2)]
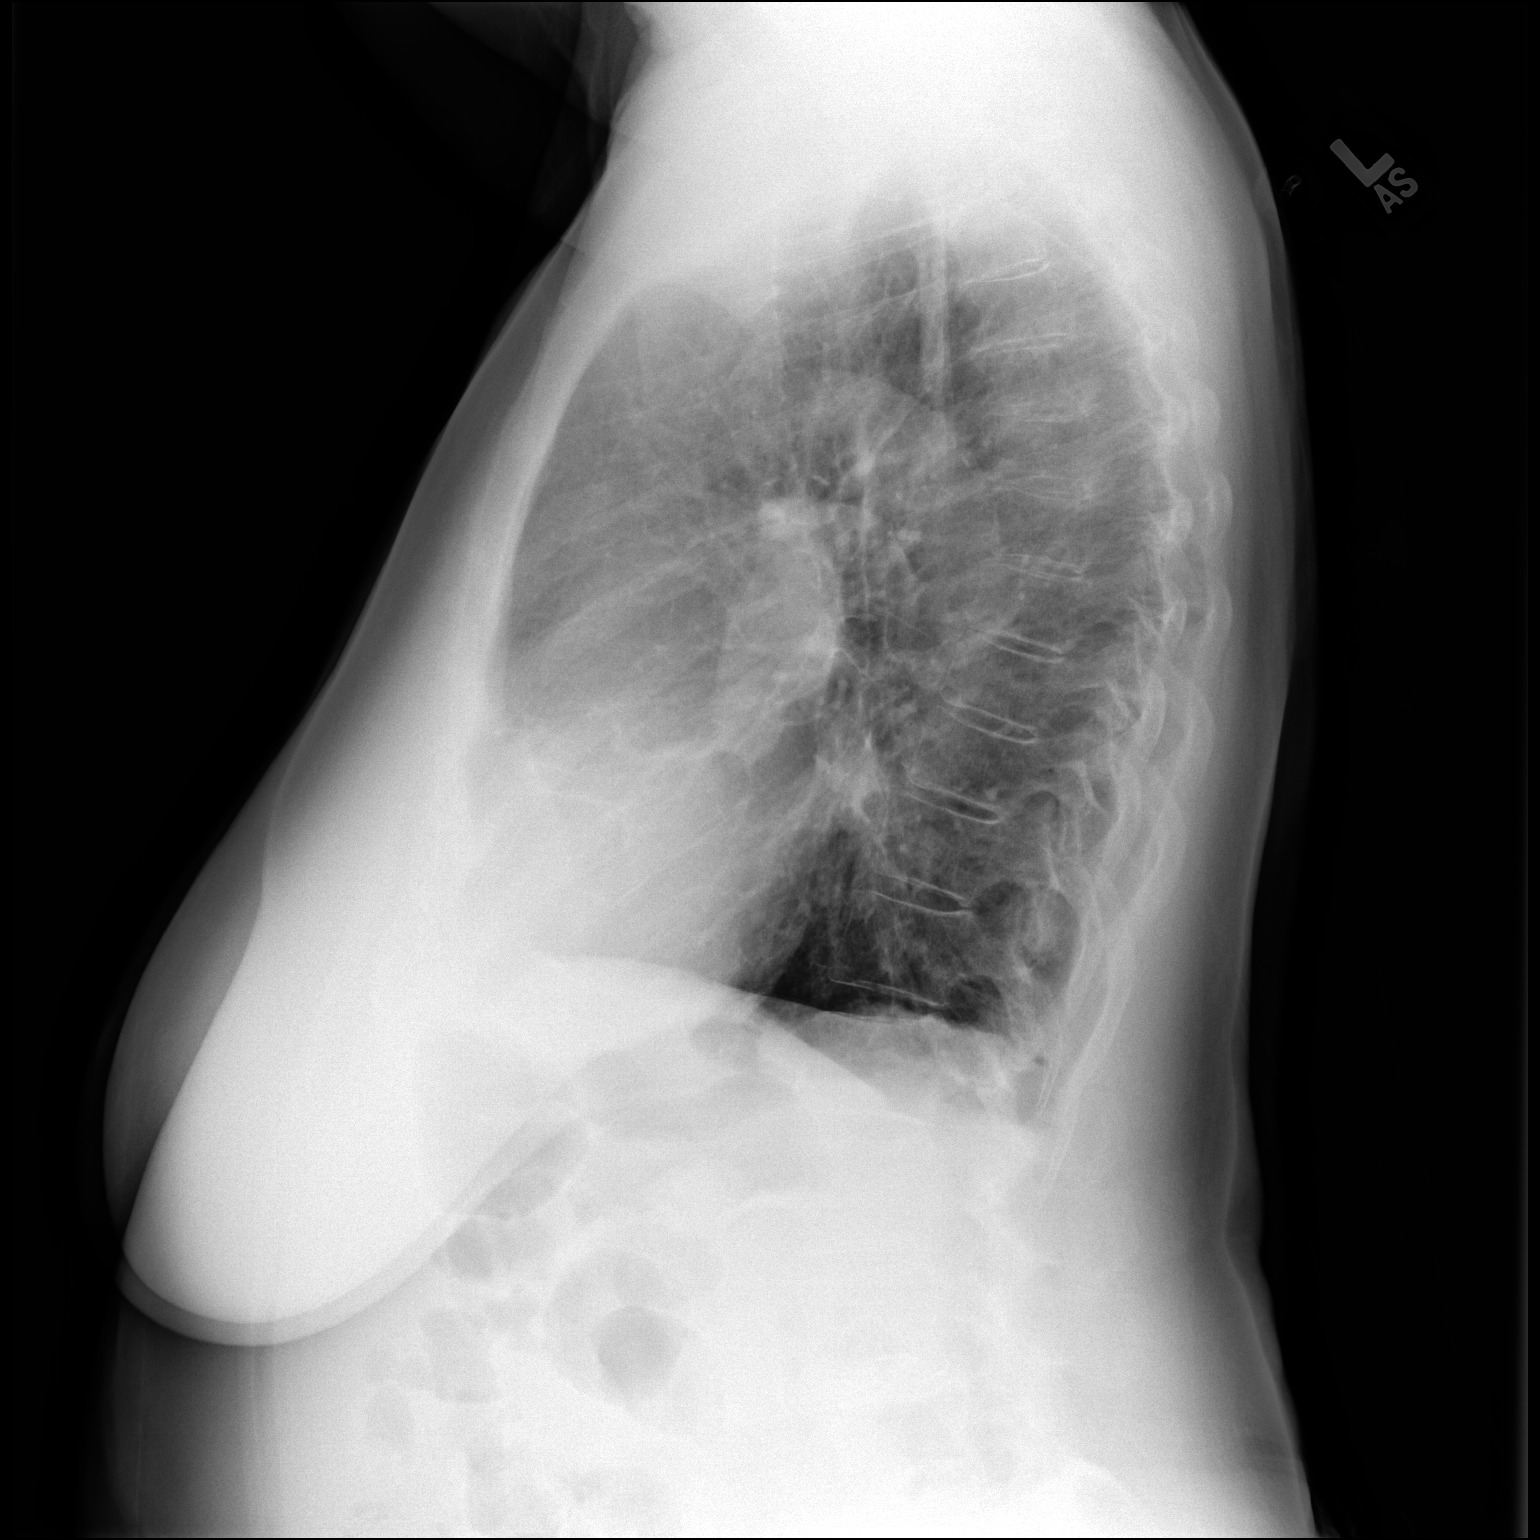

[2 of 2 positions shown; findings below may reference images not displayed]

FINDINGS: The cardiac silhouette, mediastinal and hilar contours are within
normal limits. Mild tortuosity and calcification of the thoracic
aorta.

The lungs are clear of an acute process. No pulmonary lesions or
pleural effusions. The bony thorax is intact.
IMPRESSION: No acute cardiopulmonary findings.

## 2022-05-08 ENCOUNTER — Other Ambulatory Visit (HOSPITAL_COMMUNITY): Payer: Self-pay

## 2022-05-21 DIAGNOSIS — I10 Essential (primary) hypertension: Secondary | ICD-10-CM | POA: Diagnosis not present

## 2022-05-21 DIAGNOSIS — E7800 Pure hypercholesterolemia, unspecified: Secondary | ICD-10-CM | POA: Diagnosis not present

## 2022-05-21 DIAGNOSIS — J309 Allergic rhinitis, unspecified: Secondary | ICD-10-CM | POA: Diagnosis not present

## 2022-05-21 DIAGNOSIS — E039 Hypothyroidism, unspecified: Secondary | ICD-10-CM | POA: Diagnosis not present

## 2022-05-21 DIAGNOSIS — M109 Gout, unspecified: Secondary | ICD-10-CM | POA: Diagnosis not present

## 2022-05-21 DIAGNOSIS — M5090 Cervical disc disorder, unspecified, unspecified cervical region: Secondary | ICD-10-CM | POA: Diagnosis not present

## 2022-05-21 DIAGNOSIS — Z1239 Encounter for other screening for malignant neoplasm of breast: Secondary | ICD-10-CM | POA: Diagnosis not present

## 2022-05-21 DIAGNOSIS — M8580 Other specified disorders of bone density and structure, unspecified site: Secondary | ICD-10-CM | POA: Diagnosis not present

## 2022-06-06 ENCOUNTER — Other Ambulatory Visit (HOSPITAL_COMMUNITY): Payer: Self-pay

## 2022-07-23 DIAGNOSIS — E039 Hypothyroidism, unspecified: Secondary | ICD-10-CM | POA: Diagnosis not present

## 2022-07-30 DIAGNOSIS — M62830 Muscle spasm of back: Secondary | ICD-10-CM | POA: Diagnosis not present

## 2022-07-30 DIAGNOSIS — M9902 Segmental and somatic dysfunction of thoracic region: Secondary | ICD-10-CM | POA: Diagnosis not present

## 2022-07-30 DIAGNOSIS — M9901 Segmental and somatic dysfunction of cervical region: Secondary | ICD-10-CM | POA: Diagnosis not present

## 2022-07-30 DIAGNOSIS — M546 Pain in thoracic spine: Secondary | ICD-10-CM | POA: Diagnosis not present

## 2022-07-30 DIAGNOSIS — M545 Low back pain, unspecified: Secondary | ICD-10-CM | POA: Diagnosis not present

## 2022-07-30 DIAGNOSIS — M542 Cervicalgia: Secondary | ICD-10-CM | POA: Diagnosis not present

## 2022-07-30 DIAGNOSIS — M9903 Segmental and somatic dysfunction of lumbar region: Secondary | ICD-10-CM | POA: Diagnosis not present

## 2022-07-30 DIAGNOSIS — M62838 Other muscle spasm: Secondary | ICD-10-CM | POA: Diagnosis not present

## 2022-08-20 DIAGNOSIS — M7062 Trochanteric bursitis, left hip: Secondary | ICD-10-CM | POA: Diagnosis not present

## 2022-08-20 DIAGNOSIS — Z23 Encounter for immunization: Secondary | ICD-10-CM | POA: Diagnosis not present

## 2022-08-20 DIAGNOSIS — M519 Unspecified thoracic, thoracolumbar and lumbosacral intervertebral disc disorder: Secondary | ICD-10-CM | POA: Diagnosis not present

## 2022-09-02 DIAGNOSIS — H2513 Age-related nuclear cataract, bilateral: Secondary | ICD-10-CM | POA: Diagnosis not present

## 2022-09-02 DIAGNOSIS — H5203 Hypermetropia, bilateral: Secondary | ICD-10-CM | POA: Diagnosis not present

## 2022-09-10 DIAGNOSIS — J40 Bronchitis, not specified as acute or chronic: Secondary | ICD-10-CM | POA: Diagnosis not present

## 2022-10-14 DIAGNOSIS — H35033 Hypertensive retinopathy, bilateral: Secondary | ICD-10-CM | POA: Diagnosis not present

## 2022-10-14 DIAGNOSIS — H5203 Hypermetropia, bilateral: Secondary | ICD-10-CM | POA: Diagnosis not present

## 2022-10-14 DIAGNOSIS — M25562 Pain in left knee: Secondary | ICD-10-CM | POA: Diagnosis not present

## 2022-10-14 DIAGNOSIS — M25552 Pain in left hip: Secondary | ICD-10-CM | POA: Diagnosis not present

## 2022-10-14 DIAGNOSIS — H52223 Regular astigmatism, bilateral: Secondary | ICD-10-CM | POA: Diagnosis not present

## 2022-10-14 DIAGNOSIS — M1612 Unilateral primary osteoarthritis, left hip: Secondary | ICD-10-CM | POA: Diagnosis not present

## 2022-10-14 DIAGNOSIS — H53143 Visual discomfort, bilateral: Secondary | ICD-10-CM | POA: Diagnosis not present

## 2022-10-14 DIAGNOSIS — I10 Essential (primary) hypertension: Secondary | ICD-10-CM | POA: Diagnosis not present

## 2022-10-14 DIAGNOSIS — H524 Presbyopia: Secondary | ICD-10-CM | POA: Diagnosis not present

## 2022-10-14 DIAGNOSIS — H25819 Combined forms of age-related cataract, unspecified eye: Secondary | ICD-10-CM | POA: Diagnosis not present

## 2022-10-14 DIAGNOSIS — H43813 Vitreous degeneration, bilateral: Secondary | ICD-10-CM | POA: Diagnosis not present

## 2022-10-14 DIAGNOSIS — M1712 Unilateral primary osteoarthritis, left knee: Secondary | ICD-10-CM | POA: Diagnosis not present

## 2022-11-10 DIAGNOSIS — R269 Unspecified abnormalities of gait and mobility: Secondary | ICD-10-CM | POA: Diagnosis not present

## 2022-11-10 DIAGNOSIS — M25552 Pain in left hip: Secondary | ICD-10-CM | POA: Diagnosis not present

## 2022-11-10 DIAGNOSIS — M62552 Muscle wasting and atrophy, not elsewhere classified, left thigh: Secondary | ICD-10-CM | POA: Diagnosis not present

## 2022-11-10 DIAGNOSIS — Z7409 Other reduced mobility: Secondary | ICD-10-CM | POA: Diagnosis not present

## 2022-11-12 DIAGNOSIS — M25552 Pain in left hip: Secondary | ICD-10-CM | POA: Diagnosis not present

## 2022-11-12 DIAGNOSIS — Z7409 Other reduced mobility: Secondary | ICD-10-CM | POA: Diagnosis not present

## 2022-11-12 DIAGNOSIS — R269 Unspecified abnormalities of gait and mobility: Secondary | ICD-10-CM | POA: Diagnosis not present

## 2022-11-12 DIAGNOSIS — M62552 Muscle wasting and atrophy, not elsewhere classified, left thigh: Secondary | ICD-10-CM | POA: Diagnosis not present

## 2022-11-17 DIAGNOSIS — R269 Unspecified abnormalities of gait and mobility: Secondary | ICD-10-CM | POA: Diagnosis not present

## 2022-11-17 DIAGNOSIS — M25552 Pain in left hip: Secondary | ICD-10-CM | POA: Diagnosis not present

## 2022-11-17 DIAGNOSIS — M62552 Muscle wasting and atrophy, not elsewhere classified, left thigh: Secondary | ICD-10-CM | POA: Diagnosis not present

## 2022-11-17 DIAGNOSIS — Z7409 Other reduced mobility: Secondary | ICD-10-CM | POA: Diagnosis not present

## 2022-11-18 DIAGNOSIS — M25562 Pain in left knee: Secondary | ICD-10-CM | POA: Diagnosis not present

## 2022-11-18 DIAGNOSIS — M25552 Pain in left hip: Secondary | ICD-10-CM | POA: Diagnosis not present

## 2022-11-19 DIAGNOSIS — Z7409 Other reduced mobility: Secondary | ICD-10-CM | POA: Diagnosis not present

## 2022-11-19 DIAGNOSIS — M62552 Muscle wasting and atrophy, not elsewhere classified, left thigh: Secondary | ICD-10-CM | POA: Diagnosis not present

## 2022-11-19 DIAGNOSIS — R269 Unspecified abnormalities of gait and mobility: Secondary | ICD-10-CM | POA: Diagnosis not present

## 2022-11-19 DIAGNOSIS — M25552 Pain in left hip: Secondary | ICD-10-CM | POA: Diagnosis not present

## 2022-11-26 DIAGNOSIS — M25552 Pain in left hip: Secondary | ICD-10-CM | POA: Diagnosis not present

## 2022-11-26 DIAGNOSIS — Z7409 Other reduced mobility: Secondary | ICD-10-CM | POA: Diagnosis not present

## 2022-11-26 DIAGNOSIS — R269 Unspecified abnormalities of gait and mobility: Secondary | ICD-10-CM | POA: Diagnosis not present

## 2022-11-26 DIAGNOSIS — M62552 Muscle wasting and atrophy, not elsewhere classified, left thigh: Secondary | ICD-10-CM | POA: Diagnosis not present

## 2022-11-28 DIAGNOSIS — Z7409 Other reduced mobility: Secondary | ICD-10-CM | POA: Diagnosis not present

## 2022-11-28 DIAGNOSIS — R269 Unspecified abnormalities of gait and mobility: Secondary | ICD-10-CM | POA: Diagnosis not present

## 2022-11-28 DIAGNOSIS — M62552 Muscle wasting and atrophy, not elsewhere classified, left thigh: Secondary | ICD-10-CM | POA: Diagnosis not present

## 2022-11-28 DIAGNOSIS — M25552 Pain in left hip: Secondary | ICD-10-CM | POA: Diagnosis not present

## 2022-12-01 DIAGNOSIS — R269 Unspecified abnormalities of gait and mobility: Secondary | ICD-10-CM | POA: Diagnosis not present

## 2022-12-01 DIAGNOSIS — M62552 Muscle wasting and atrophy, not elsewhere classified, left thigh: Secondary | ICD-10-CM | POA: Diagnosis not present

## 2022-12-01 DIAGNOSIS — Z7409 Other reduced mobility: Secondary | ICD-10-CM | POA: Diagnosis not present

## 2022-12-01 DIAGNOSIS — M25552 Pain in left hip: Secondary | ICD-10-CM | POA: Diagnosis not present

## 2022-12-03 DIAGNOSIS — Z7409 Other reduced mobility: Secondary | ICD-10-CM | POA: Diagnosis not present

## 2022-12-03 DIAGNOSIS — R269 Unspecified abnormalities of gait and mobility: Secondary | ICD-10-CM | POA: Diagnosis not present

## 2022-12-03 DIAGNOSIS — M62552 Muscle wasting and atrophy, not elsewhere classified, left thigh: Secondary | ICD-10-CM | POA: Diagnosis not present

## 2022-12-03 DIAGNOSIS — M25552 Pain in left hip: Secondary | ICD-10-CM | POA: Diagnosis not present

## 2022-12-15 DIAGNOSIS — R269 Unspecified abnormalities of gait and mobility: Secondary | ICD-10-CM | POA: Diagnosis not present

## 2022-12-15 DIAGNOSIS — M25552 Pain in left hip: Secondary | ICD-10-CM | POA: Diagnosis not present

## 2022-12-15 DIAGNOSIS — Z7409 Other reduced mobility: Secondary | ICD-10-CM | POA: Diagnosis not present

## 2022-12-15 DIAGNOSIS — M62552 Muscle wasting and atrophy, not elsewhere classified, left thigh: Secondary | ICD-10-CM | POA: Diagnosis not present

## 2022-12-24 DIAGNOSIS — B070 Plantar wart: Secondary | ICD-10-CM | POA: Diagnosis not present

## 2022-12-24 DIAGNOSIS — I10 Essential (primary) hypertension: Secondary | ICD-10-CM | POA: Diagnosis not present

## 2023-01-06 DIAGNOSIS — Z23 Encounter for immunization: Secondary | ICD-10-CM | POA: Diagnosis not present

## 2023-02-05 DIAGNOSIS — L304 Erythema intertrigo: Secondary | ICD-10-CM | POA: Diagnosis not present

## 2023-02-05 DIAGNOSIS — L300 Nummular dermatitis: Secondary | ICD-10-CM | POA: Diagnosis not present

## 2023-02-05 DIAGNOSIS — L84 Corns and callosities: Secondary | ICD-10-CM | POA: Diagnosis not present

## 2023-02-18 ENCOUNTER — Other Ambulatory Visit: Payer: Self-pay | Admitting: Internal Medicine

## 2023-02-18 DIAGNOSIS — E039 Hypothyroidism, unspecified: Secondary | ICD-10-CM | POA: Diagnosis not present

## 2023-02-18 DIAGNOSIS — J309 Allergic rhinitis, unspecified: Secondary | ICD-10-CM | POA: Diagnosis not present

## 2023-02-18 DIAGNOSIS — F419 Anxiety disorder, unspecified: Secondary | ICD-10-CM | POA: Diagnosis not present

## 2023-02-18 DIAGNOSIS — M5090 Cervical disc disorder, unspecified, unspecified cervical region: Secondary | ICD-10-CM | POA: Diagnosis not present

## 2023-02-18 DIAGNOSIS — Z1239 Encounter for other screening for malignant neoplasm of breast: Secondary | ICD-10-CM | POA: Diagnosis not present

## 2023-02-18 DIAGNOSIS — M519 Unspecified thoracic, thoracolumbar and lumbosacral intervertebral disc disorder: Secondary | ICD-10-CM | POA: Diagnosis not present

## 2023-02-18 DIAGNOSIS — M8580 Other specified disorders of bone density and structure, unspecified site: Secondary | ICD-10-CM | POA: Diagnosis not present

## 2023-02-18 DIAGNOSIS — M109 Gout, unspecified: Secondary | ICD-10-CM | POA: Diagnosis not present

## 2023-02-18 DIAGNOSIS — Z0000 Encounter for general adult medical examination without abnormal findings: Secondary | ICD-10-CM | POA: Diagnosis not present

## 2023-02-18 DIAGNOSIS — R011 Cardiac murmur, unspecified: Secondary | ICD-10-CM | POA: Diagnosis not present

## 2023-02-18 DIAGNOSIS — R7309 Other abnormal glucose: Secondary | ICD-10-CM | POA: Diagnosis not present

## 2023-02-18 DIAGNOSIS — I10 Essential (primary) hypertension: Secondary | ICD-10-CM | POA: Diagnosis not present

## 2023-02-18 DIAGNOSIS — E781 Pure hyperglyceridemia: Secondary | ICD-10-CM | POA: Diagnosis not present

## 2023-02-18 DIAGNOSIS — Z1231 Encounter for screening mammogram for malignant neoplasm of breast: Secondary | ICD-10-CM

## 2023-02-18 DIAGNOSIS — Z1331 Encounter for screening for depression: Secondary | ICD-10-CM | POA: Diagnosis not present

## 2023-03-04 DIAGNOSIS — R011 Cardiac murmur, unspecified: Secondary | ICD-10-CM | POA: Diagnosis not present

## 2023-04-01 ENCOUNTER — Ambulatory Visit
Admission: RE | Admit: 2023-04-01 | Discharge: 2023-04-01 | Disposition: A | Discharge status: Home / Self Care | Payer: Medicare Other | Source: Ambulatory Visit | Attending: Internal Medicine | Admitting: Internal Medicine

## 2023-04-01 DIAGNOSIS — Z1231 Encounter for screening mammogram for malignant neoplasm of breast: Secondary | ICD-10-CM

## 2023-04-07 DIAGNOSIS — H2513 Age-related nuclear cataract, bilateral: Secondary | ICD-10-CM | POA: Diagnosis not present

## 2023-04-22 DIAGNOSIS — E039 Hypothyroidism, unspecified: Secondary | ICD-10-CM | POA: Diagnosis not present

## 2023-06-23 DIAGNOSIS — E039 Hypothyroidism, unspecified: Secondary | ICD-10-CM | POA: Diagnosis not present

## 2023-07-22 DIAGNOSIS — Z23 Encounter for immunization: Secondary | ICD-10-CM | POA: Diagnosis not present

## 2023-07-23 ENCOUNTER — Ambulatory Visit: Payer: Medicare Other | Admitting: Podiatry

## 2023-07-23 ENCOUNTER — Ambulatory Visit (INDEPENDENT_AMBULATORY_CARE_PROVIDER_SITE_OTHER): Payer: Medicare Other | Admitting: Podiatry

## 2023-07-23 ENCOUNTER — Encounter: Payer: Self-pay | Admitting: Podiatry

## 2023-07-23 DIAGNOSIS — M216X1 Other acquired deformities of right foot: Secondary | ICD-10-CM

## 2023-07-23 DIAGNOSIS — Q828 Other specified congenital malformations of skin: Secondary | ICD-10-CM

## 2023-07-23 NOTE — Progress Notes (Signed)
This patient presents to the office with painful callus under the ball of her right forefoot.  She says this callus is painful  She says she has acid treatment for removal of the skin lesion but the lesion/callus has returned.  She presents for evaluation and treatment of this condition.  Vascular  Dorsalis pedis and posterior tibial pulses are palpable  B/L.  Capillary return  WNL.  Temperature gradient is  WNL.  Skin turgor  WNL  Sensorium  Senn Weinstein monofilament wire  WNL. Normal tactile sensation.  Nail Exam  Patient has normal nails with no evidence of bacterial or fungal infection.  Orthopedic  Exam  Muscle tone and muscle strength  WNL.  No limitations of motion feet  B/L.  No crepitus or joint effusion noted.  Foot type is unremarkable and digits show no abnormalities.  Hypermobile 1st MPJ right foot.  Plantar flexed second metatarsal right foot.  Skin  No open lesions.  Normal skin texture and turgor. Porokeratosis sub 2 right foot.  Porokeratosis right forefoot.  IE  Debride porokeratosis with # 15 blade.  Discussed pathology of formation of porok.  with this patient.  Conservative care vs. Surgical repair was discussed.  Padding .  RTC prn  Helane Gunther DPM

## 2023-08-19 DIAGNOSIS — M109 Gout, unspecified: Secondary | ICD-10-CM | POA: Diagnosis not present

## 2023-08-19 DIAGNOSIS — I519 Heart disease, unspecified: Secondary | ICD-10-CM | POA: Diagnosis not present

## 2023-08-19 DIAGNOSIS — M8580 Other specified disorders of bone density and structure, unspecified site: Secondary | ICD-10-CM | POA: Diagnosis not present

## 2023-08-19 DIAGNOSIS — J309 Allergic rhinitis, unspecified: Secondary | ICD-10-CM | POA: Diagnosis not present

## 2023-08-19 DIAGNOSIS — E039 Hypothyroidism, unspecified: Secondary | ICD-10-CM | POA: Diagnosis not present

## 2023-08-19 DIAGNOSIS — Z23 Encounter for immunization: Secondary | ICD-10-CM | POA: Diagnosis not present

## 2023-08-19 DIAGNOSIS — I10 Essential (primary) hypertension: Secondary | ICD-10-CM | POA: Diagnosis not present

## 2023-08-19 DIAGNOSIS — M519 Unspecified thoracic, thoracolumbar and lumbosacral intervertebral disc disorder: Secondary | ICD-10-CM | POA: Diagnosis not present

## 2023-08-25 ENCOUNTER — Ambulatory Visit
Admission: RE | Admit: 2023-08-25 | Discharge: 2023-08-25 | Disposition: A | Discharge status: Home / Self Care | Payer: Medicare Other | Source: Ambulatory Visit | Attending: Internal Medicine | Admitting: Internal Medicine

## 2023-08-25 ENCOUNTER — Other Ambulatory Visit: Payer: Self-pay | Admitting: Internal Medicine

## 2023-08-25 DIAGNOSIS — M25561 Pain in right knee: Secondary | ICD-10-CM | POA: Diagnosis not present

## 2023-08-25 DIAGNOSIS — S8991XA Unspecified injury of right lower leg, initial encounter: Secondary | ICD-10-CM | POA: Diagnosis not present

## 2023-09-14 ENCOUNTER — Ambulatory Visit (INDEPENDENT_AMBULATORY_CARE_PROVIDER_SITE_OTHER): Payer: Medicare Other | Admitting: Podiatry

## 2023-09-14 ENCOUNTER — Ambulatory Visit: Payer: Medicare Other | Admitting: Podiatry

## 2023-09-14 DIAGNOSIS — M779 Enthesopathy, unspecified: Secondary | ICD-10-CM | POA: Diagnosis not present

## 2023-09-14 NOTE — Progress Notes (Signed)
This patient returns to the office saying she had pain in her forefoot right foot.  She said it was painful about four days ago but now the foot is painfree.  She says that after her shower it got better.  She presents for evaluation and treatment.  Vascular  Dorsalis pedis and posterior tibial pulses are palpable  B/L.  Capillary return  WNL.  Temperature gradient is  WNL.  Skin turgor  WNL  Sensorium  Senn Weinstein monofilament wire  WNL. Normal tactile sensation.  Nail Exam  Patient has normal nails with no evidence of bacterial or fungal infection.  Orthopedic  Exam  Muscle tone and muscle strength  WNL.  No limitations of motion feet  B/L.  No crepitus or joint effusion noted.  Foot type is unremarkable and digits show no abnormalities.  HAV  B/L.  Plantar fat atrophy right foot.   Skin  No open lesions.  Normal skin texture and turgor.   Capsulitis secondary to atrophy fat pad.  ROV.  Discussed this condition with this patient.   Padding applied to shoes.  RTC 4 weeks since she is concerned about her feet and she is going on vacation in December.  Helane Gunther DPM

## 2023-10-14 DIAGNOSIS — H43393 Other vitreous opacities, bilateral: Secondary | ICD-10-CM | POA: Diagnosis not present

## 2023-10-14 DIAGNOSIS — H25813 Combined forms of age-related cataract, bilateral: Secondary | ICD-10-CM | POA: Diagnosis not present

## 2023-10-14 DIAGNOSIS — H5203 Hypermetropia, bilateral: Secondary | ICD-10-CM | POA: Diagnosis not present

## 2023-10-14 DIAGNOSIS — I10 Essential (primary) hypertension: Secondary | ICD-10-CM | POA: Diagnosis not present

## 2023-10-14 DIAGNOSIS — H524 Presbyopia: Secondary | ICD-10-CM | POA: Diagnosis not present

## 2023-10-14 DIAGNOSIS — H43813 Vitreous degeneration, bilateral: Secondary | ICD-10-CM | POA: Diagnosis not present

## 2023-10-14 DIAGNOSIS — H35033 Hypertensive retinopathy, bilateral: Secondary | ICD-10-CM | POA: Diagnosis not present

## 2023-10-14 DIAGNOSIS — H52223 Regular astigmatism, bilateral: Secondary | ICD-10-CM | POA: Diagnosis not present

## 2023-10-15 ENCOUNTER — Encounter: Payer: Self-pay | Admitting: Podiatry

## 2023-10-15 ENCOUNTER — Ambulatory Visit (INDEPENDENT_AMBULATORY_CARE_PROVIDER_SITE_OTHER): Payer: Medicare Other | Admitting: Podiatry

## 2023-10-15 DIAGNOSIS — M216X1 Other acquired deformities of right foot: Secondary | ICD-10-CM

## 2023-10-15 DIAGNOSIS — M779 Enthesopathy, unspecified: Secondary | ICD-10-CM | POA: Diagnosis not present

## 2023-10-15 DIAGNOSIS — Q828 Other specified congenital malformations of skin: Secondary | ICD-10-CM | POA: Diagnosis not present

## 2023-10-15 NOTE — Progress Notes (Signed)
This patient returns to the office for evaluation of her right forefoot.  She says she is not experiencing any pain now.  She says when she does feel pain she soaks it in warm water and the condition improves.   She presents for evaluation and treatment.  Vascular  Dorsalis pedis and posterior tibial pulses are palpable  B/L.  Capillary return  WNL.  Temperature gradient is  WNL.  Skin turgor  WNL  Sensorium  Senn Weinstein monofilament wire  WNL. Normal tactile sensation.  Nail Exam  Patient has normal nails with no evidence of bacterial or fungal infection.  Orthopedic  Exam  Muscle tone and muscle strength  WNL.  No limitations of motion feet  B/L.  No crepitus or joint effusion noted.  Foot type is unremarkable and digits show no abnormalities.  Bony prominences are unremarkable..  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  Skin  No open lesions.  Normal skin texture and turgor. Porokeratosis noted sub 2 right foot.  Porokeratosis/capsulitis sub 2 right foot.  ROV.  Discussed condition with patient.  Padding dispensed.  RTC prn.  Helane Gunther DPM

## 2023-11-09 ENCOUNTER — Other Ambulatory Visit: Payer: Self-pay

## 2023-11-13 ENCOUNTER — Other Ambulatory Visit: Payer: Self-pay

## 2023-11-30 DIAGNOSIS — H2513 Age-related nuclear cataract, bilateral: Secondary | ICD-10-CM | POA: Diagnosis not present

## 2024-02-24 DIAGNOSIS — F419 Anxiety disorder, unspecified: Secondary | ICD-10-CM | POA: Diagnosis not present

## 2024-02-24 DIAGNOSIS — J309 Allergic rhinitis, unspecified: Secondary | ICD-10-CM | POA: Diagnosis not present

## 2024-02-24 DIAGNOSIS — Z0000 Encounter for general adult medical examination without abnormal findings: Secondary | ICD-10-CM | POA: Diagnosis not present

## 2024-02-24 DIAGNOSIS — M519 Unspecified thoracic, thoracolumbar and lumbosacral intervertebral disc disorder: Secondary | ICD-10-CM | POA: Diagnosis not present

## 2024-02-24 DIAGNOSIS — Z1331 Encounter for screening for depression: Secondary | ICD-10-CM | POA: Diagnosis not present

## 2024-02-24 DIAGNOSIS — Z23 Encounter for immunization: Secondary | ICD-10-CM | POA: Diagnosis not present

## 2024-02-24 DIAGNOSIS — M8580 Other specified disorders of bone density and structure, unspecified site: Secondary | ICD-10-CM | POA: Diagnosis not present

## 2024-02-24 DIAGNOSIS — E7800 Pure hypercholesterolemia, unspecified: Secondary | ICD-10-CM | POA: Diagnosis not present

## 2024-02-24 DIAGNOSIS — M109 Gout, unspecified: Secondary | ICD-10-CM | POA: Diagnosis not present

## 2024-02-24 DIAGNOSIS — I519 Heart disease, unspecified: Secondary | ICD-10-CM | POA: Diagnosis not present

## 2024-02-24 DIAGNOSIS — E039 Hypothyroidism, unspecified: Secondary | ICD-10-CM | POA: Diagnosis not present

## 2024-02-24 DIAGNOSIS — I10 Essential (primary) hypertension: Secondary | ICD-10-CM | POA: Diagnosis not present

## 2024-08-24 DIAGNOSIS — Z23 Encounter for immunization: Secondary | ICD-10-CM | POA: Diagnosis not present

## 2024-08-31 DIAGNOSIS — Z23 Encounter for immunization: Secondary | ICD-10-CM | POA: Diagnosis not present

## 2024-08-31 DIAGNOSIS — M519 Unspecified thoracic, thoracolumbar and lumbosacral intervertebral disc disorder: Secondary | ICD-10-CM | POA: Diagnosis not present

## 2024-08-31 DIAGNOSIS — E039 Hypothyroidism, unspecified: Secondary | ICD-10-CM | POA: Diagnosis not present

## 2024-08-31 DIAGNOSIS — M109 Gout, unspecified: Secondary | ICD-10-CM | POA: Diagnosis not present

## 2024-08-31 DIAGNOSIS — J309 Allergic rhinitis, unspecified: Secondary | ICD-10-CM | POA: Diagnosis not present

## 2024-08-31 DIAGNOSIS — G8929 Other chronic pain: Secondary | ICD-10-CM | POA: Diagnosis not present

## 2024-08-31 DIAGNOSIS — I519 Heart disease, unspecified: Secondary | ICD-10-CM | POA: Diagnosis not present

## 2024-08-31 DIAGNOSIS — M5090 Cervical disc disorder, unspecified, unspecified cervical region: Secondary | ICD-10-CM | POA: Diagnosis not present

## 2024-08-31 DIAGNOSIS — M8580 Other specified disorders of bone density and structure, unspecified site: Secondary | ICD-10-CM | POA: Diagnosis not present

## 2024-08-31 DIAGNOSIS — F419 Anxiety disorder, unspecified: Secondary | ICD-10-CM | POA: Diagnosis not present

## 2024-08-31 DIAGNOSIS — I10 Essential (primary) hypertension: Secondary | ICD-10-CM | POA: Diagnosis not present

## 2024-08-31 DIAGNOSIS — E7800 Pure hypercholesterolemia, unspecified: Secondary | ICD-10-CM | POA: Diagnosis not present
